# Patient Record
Sex: Female | Born: 1955
Health system: Southern US, Community
[De-identification: ages and names within clinical notes are randomized; demographics above are authoritative.]

## PROBLEM LIST (undated history)

## (undated) DIAGNOSIS — R519 Headache, unspecified: Secondary | ICD-10-CM

## (undated) DIAGNOSIS — K219 Gastro-esophageal reflux disease without esophagitis: Secondary | ICD-10-CM

## (undated) DIAGNOSIS — G4730 Sleep apnea, unspecified: Secondary | ICD-10-CM

## (undated) DIAGNOSIS — M1990 Unspecified osteoarthritis, unspecified site: Secondary | ICD-10-CM

## (undated) DIAGNOSIS — F419 Anxiety disorder, unspecified: Secondary | ICD-10-CM

## (undated) HISTORY — PX: OTHER SURGICAL HISTORY: SHX169

## (undated) HISTORY — PX: SHOULDER ARTHROSCOPY: SHX128

## (undated) HISTORY — PX: EYE SURGERY: SHX253

---

## 1999-05-31 ENCOUNTER — Encounter: Payer: Self-pay | Admitting: Obstetrics and Gynecology

## 1999-05-31 ENCOUNTER — Encounter: Admission: RE | Admit: 1999-05-31 | Discharge: 1999-05-31 | Payer: Self-pay | Admitting: Obstetrics and Gynecology

## 1999-06-05 ENCOUNTER — Encounter: Payer: Self-pay | Admitting: Obstetrics and Gynecology

## 1999-06-05 ENCOUNTER — Encounter: Admission: RE | Admit: 1999-06-05 | Discharge: 1999-06-05 | Payer: Self-pay | Admitting: Obstetrics and Gynecology

## 1999-12-11 ENCOUNTER — Encounter: Admission: RE | Admit: 1999-12-11 | Discharge: 1999-12-11 | Payer: Self-pay | Admitting: Diagnostic Radiology

## 1999-12-11 ENCOUNTER — Encounter: Payer: Self-pay | Admitting: Diagnostic Radiology

## 2000-06-20 ENCOUNTER — Encounter: Admission: RE | Admit: 2000-06-20 | Discharge: 2000-06-20 | Payer: Self-pay | Admitting: Obstetrics and Gynecology

## 2000-06-20 ENCOUNTER — Encounter: Payer: Self-pay | Admitting: Obstetrics and Gynecology

## 2001-08-12 ENCOUNTER — Encounter: Admission: RE | Admit: 2001-08-12 | Discharge: 2001-08-12 | Payer: Self-pay | Admitting: Obstetrics and Gynecology

## 2001-08-12 ENCOUNTER — Encounter: Payer: Self-pay | Admitting: Obstetrics and Gynecology

## 2002-09-22 ENCOUNTER — Encounter: Admission: RE | Admit: 2002-09-22 | Discharge: 2002-09-22 | Payer: Self-pay | Admitting: Obstetrics and Gynecology

## 2002-09-22 ENCOUNTER — Encounter: Payer: Self-pay | Admitting: Obstetrics and Gynecology

## 2004-02-14 ENCOUNTER — Encounter: Admission: RE | Admit: 2004-02-14 | Discharge: 2004-02-14 | Payer: Self-pay | Admitting: Family Medicine

## 2005-02-28 ENCOUNTER — Encounter: Admission: RE | Admit: 2005-02-28 | Discharge: 2005-02-28 | Payer: Self-pay | Admitting: Family Medicine

## 2006-10-28 ENCOUNTER — Encounter: Admission: RE | Admit: 2006-10-28 | Discharge: 2006-10-28 | Payer: Self-pay | Admitting: Family Medicine

## 2007-11-18 ENCOUNTER — Encounter: Admission: RE | Admit: 2007-11-18 | Discharge: 2007-11-18 | Payer: Self-pay | Admitting: Family Medicine

## 2007-11-25 ENCOUNTER — Encounter: Admission: RE | Admit: 2007-11-25 | Discharge: 2007-11-25 | Payer: Self-pay | Admitting: Family Medicine

## 2007-11-30 ENCOUNTER — Encounter: Admission: RE | Admit: 2007-11-30 | Discharge: 2007-11-30 | Payer: Self-pay | Admitting: Cardiology

## 2007-12-04 ENCOUNTER — Inpatient Hospital Stay (HOSPITAL_BASED_OUTPATIENT_CLINIC_OR_DEPARTMENT_OTHER): Admission: RE | Admit: 2007-12-04 | Discharge: 2007-12-04 | Payer: Self-pay | Admitting: Cardiology

## 2008-11-23 ENCOUNTER — Encounter: Admission: RE | Admit: 2008-11-23 | Discharge: 2008-11-23 | Payer: Self-pay | Admitting: Family Medicine

## 2009-12-04 ENCOUNTER — Encounter: Admission: RE | Admit: 2009-12-04 | Discharge: 2009-12-04 | Payer: Self-pay | Admitting: Family Medicine

## 2010-01-31 ENCOUNTER — Encounter: Admission: RE | Admit: 2010-01-31 | Discharge: 2010-01-31 | Payer: Self-pay | Admitting: Obstetrics and Gynecology

## 2010-08-26 ENCOUNTER — Encounter: Payer: Self-pay | Admitting: Family Medicine

## 2010-12-18 NOTE — Cardiovascular Report (Signed)
Cheyenne Miller, Cheyenne Miller NO.:  [id number]   MEDICAL RECORD NO.:  [id number]          PATIENT TYPE:  OIB   LOCATION:  1962                         FACILITY:  [facility]   PHYSICIAN:  Armanda Magic, M.D.     DATE OF BIRTH:  10/16/1955   DATE OF PROCEDURE:  12/04/2007  DATE OF DISCHARGE:                            CARDIAC CATHETERIZATION   REFERRING PHYSICIAN:  Gretta Arab. Valentina Lucks, MD   PROCEDURE:  Left heart catheterization, coronary angiography, and left  ventriculography.   OPERATOR:  Armanda Magic, MD   INDICATIONS:  Chest pain and abnormal stress Cardiolite study.   COMPLICATIONS:  None.   IV ACCESS:  Via right femoral artery 4-French sheath.   IV MEDICATIONS:  1. Versed 1 mg.  2. Fentanyl 25 mcg IV.   This is a 55 year old female with a history of recent episodes of  substernal chest pain.  She also has a history of dyslipidemia and  family history of heart disease.  She underwent a stress Cardiolite  study, which showed a small reversible defect in the apical inferior  region.  She now presents for cardiac catheterization.   The patient was brought to the cardiac catheterization laboratory in a  fasting nonsedated state.  Informed consent was obtained.  The patient  was connected to continuous heart rate and pulse oximetry monitoring and  intermittent blood pressure monitoring.  The right groin was prepped and  draped in a sterile fashion.  A 1% Xylocaine was used for local  anesthesia.  Using a modified Seldinger technique, a 4-French sheath was  placed in right femoral artery.  Under fluoroscopic guidance, a 4-French  JL-4 catheter was placed in left coronary artery.  Multiple cine films  were taken at 30-degree RAO and 40-degree LAO views.  This catheter was  then exchanged out over a guidewire for a 4-French JR-4 catheter, which  was placed under fluoroscopic guidance in the right coronary artery.  Multiple cine films were taken at 30-degree RAO and  40-degree LAO views.  This catheter was then exchanged out over a guidewire for 4-French  angled pigtail catheter, which was placed under fluoroscopic guidance in  the left ventricular cavity.  Left ventriculography was performed in a  30-degree RAO view using a total of 25 mL of contrast at 12 mL per  second.  The catheter was then pulled back across the aortic valve with  no significant gradient noted.  At the end procedure, all catheters and  sheaths were removed.  Manual compression was performed to adequate  hemostasis was obtained.  The patient was later discharged home after  her groin was stable, and she was ambulating.   RESULTS:  The left main coronary artery is widely patent and bifurcates  in the left anterior descending artery and left circumflex artery.   Left anterior descending artery is widely patent throughout its course.  The apex giving rise to a first diagonal, which is widely patent and  bifurcates in  two-daughter branch, both of which were widely patent.  The ongoing LAD is widely patent throughout its course.  The apex gives  rise to a second  large diagonal branch which trifurcates in three-  daughter branch, all of which were widely patent.   Left circumflex is widely patent throughout its course in the AV groove.  It gives rise to a high obtuse marginal one branch, which is small-to-  moderate in size and is widely patent and it gives rise to a second  larger obtuse marginal two branch, which is widely patent.  The ongoing  left circumflex is widely patent.   The right coronary artery is widely patent throughout its course  inferiorly and bifurcates into the posterior descending and posterior  lateral artery, both of which are widely patent.   Left ventriculography shows normal LV systolic function.  No regional  wall motion abnormalities.  Aortic pressure was 97/63 mmHg.  LV pressure  96/7 mmHg.  No significant aortic valve gradient.   IMPRESSION:  1.  Noncardiac chest pain.  2. Normal coronary arteries.  3. Normal LV function.  4. Dyslipidemia.   PLAN:  Discharge to home after IV fluids and bedrest had been completed,  and she will follow up with my nurse practitioner, Memory Dance, in 2  weeks.      Armanda Magic, M.D.  Electronically Signed     TT/MEDQ  D:  12/04/2007  T:  12/05/2007  Job:  [id number]   cc:   Gretta Arab. Valentina Lucks, M.D.

## 2010-12-25 ENCOUNTER — Other Ambulatory Visit: Payer: Self-pay | Admitting: Family Medicine

## 2010-12-25 DIAGNOSIS — Z1231 Encounter for screening mammogram for malignant neoplasm of breast: Secondary | ICD-10-CM

## 2010-12-28 ENCOUNTER — Ambulatory Visit
Admission: RE | Admit: 2010-12-28 | Discharge: 2010-12-28 | Disposition: A | Discharge status: Home / Self Care | Payer: 59 | Source: Ambulatory Visit | Attending: Family Medicine | Admitting: Family Medicine

## 2010-12-28 DIAGNOSIS — Z1231 Encounter for screening mammogram for malignant neoplasm of breast: Secondary | ICD-10-CM

## 2012-01-02 ENCOUNTER — Other Ambulatory Visit: Payer: Self-pay | Admitting: Family Medicine

## 2012-01-02 DIAGNOSIS — Z1231 Encounter for screening mammogram for malignant neoplasm of breast: Secondary | ICD-10-CM

## 2012-01-15 ENCOUNTER — Ambulatory Visit
Admission: RE | Admit: 2012-01-15 | Discharge: 2012-01-15 | Disposition: A | Discharge status: Home / Self Care | Payer: 59 | Source: Ambulatory Visit | Attending: Family Medicine | Admitting: Family Medicine

## 2012-01-15 DIAGNOSIS — Z1231 Encounter for screening mammogram for malignant neoplasm of breast: Secondary | ICD-10-CM

## 2013-01-13 ENCOUNTER — Other Ambulatory Visit: Payer: Self-pay

## 2013-01-13 DIAGNOSIS — Z1231 Encounter for screening mammogram for malignant neoplasm of breast: Secondary | ICD-10-CM

## 2013-02-17 ENCOUNTER — Ambulatory Visit
Admission: RE | Admit: 2013-02-17 | Discharge: 2013-02-17 | Disposition: A | Discharge status: Home / Self Care | Payer: Commercial Managed Care - PPO | Source: Ambulatory Visit

## 2013-02-17 DIAGNOSIS — Z1231 Encounter for screening mammogram for malignant neoplasm of breast: Secondary | ICD-10-CM

## 2014-01-24 ENCOUNTER — Other Ambulatory Visit: Payer: Self-pay

## 2014-01-24 DIAGNOSIS — Z1231 Encounter for screening mammogram for malignant neoplasm of breast: Secondary | ICD-10-CM

## 2014-02-18 ENCOUNTER — Encounter (INDEPENDENT_AMBULATORY_CARE_PROVIDER_SITE_OTHER): Payer: Self-pay

## 2014-02-18 ENCOUNTER — Ambulatory Visit
Admission: RE | Admit: 2014-02-18 | Discharge: 2014-02-18 | Disposition: A | Discharge status: Home / Self Care | Payer: Commercial Managed Care - PPO | Source: Ambulatory Visit

## 2014-02-18 DIAGNOSIS — Z1231 Encounter for screening mammogram for malignant neoplasm of breast: Secondary | ICD-10-CM

## 2015-01-26 ENCOUNTER — Other Ambulatory Visit: Payer: Self-pay

## 2015-01-26 DIAGNOSIS — Z1231 Encounter for screening mammogram for malignant neoplasm of breast: Secondary | ICD-10-CM

## 2015-03-02 ENCOUNTER — Ambulatory Visit
Admission: RE | Admit: 2015-03-02 | Discharge: 2015-03-02 | Disposition: A | Discharge status: Home / Self Care | Payer: Commercial Managed Care - PPO | Source: Ambulatory Visit

## 2015-03-02 DIAGNOSIS — Z1231 Encounter for screening mammogram for malignant neoplasm of breast: Secondary | ICD-10-CM

## 2016-01-22 ENCOUNTER — Encounter (INDEPENDENT_AMBULATORY_CARE_PROVIDER_SITE_OTHER): Payer: Commercial Managed Care - PPO | Admitting: Ophthalmology

## 2016-01-22 DIAGNOSIS — H353132 Nonexudative age-related macular degeneration, bilateral, intermediate dry stage: Secondary | ICD-10-CM

## 2016-01-22 DIAGNOSIS — H2513 Age-related nuclear cataract, bilateral: Secondary | ICD-10-CM | POA: Diagnosis not present

## 2016-01-22 DIAGNOSIS — H43813 Vitreous degeneration, bilateral: Secondary | ICD-10-CM | POA: Diagnosis not present

## 2016-01-22 DIAGNOSIS — H33302 Unspecified retinal break, left eye: Secondary | ICD-10-CM

## 2016-02-08 ENCOUNTER — Ambulatory Visit (INDEPENDENT_AMBULATORY_CARE_PROVIDER_SITE_OTHER): Payer: Commercial Managed Care - PPO | Admitting: Ophthalmology

## 2016-02-12 ENCOUNTER — Ambulatory Visit (INDEPENDENT_AMBULATORY_CARE_PROVIDER_SITE_OTHER): Payer: Commercial Managed Care - PPO | Admitting: Ophthalmology

## 2016-02-12 DIAGNOSIS — H33302 Unspecified retinal break, left eye: Secondary | ICD-10-CM

## 2016-02-13 ENCOUNTER — Other Ambulatory Visit: Payer: Self-pay | Admitting: Family Medicine

## 2016-02-13 DIAGNOSIS — Z1231 Encounter for screening mammogram for malignant neoplasm of breast: Secondary | ICD-10-CM

## 2016-03-04 ENCOUNTER — Ambulatory Visit: Payer: Commercial Managed Care - PPO

## 2016-03-14 ENCOUNTER — Ambulatory Visit
Admission: RE | Admit: 2016-03-14 | Discharge: 2016-03-14 | Disposition: A | Discharge status: Home / Self Care | Payer: Commercial Managed Care - PPO | Source: Ambulatory Visit | Attending: Family Medicine | Admitting: Family Medicine

## 2016-03-14 DIAGNOSIS — Z1231 Encounter for screening mammogram for malignant neoplasm of breast: Secondary | ICD-10-CM

## 2016-03-25 ENCOUNTER — Ambulatory Visit
Admission: RE | Admit: 2016-03-25 | Discharge: 2016-03-25 | Disposition: A | Discharge status: Home / Self Care | Payer: Commercial Managed Care - PPO | Source: Ambulatory Visit | Attending: Family Medicine | Admitting: Family Medicine

## 2016-03-25 ENCOUNTER — Other Ambulatory Visit: Payer: Self-pay | Admitting: Family Medicine

## 2016-03-25 DIAGNOSIS — R05 Cough: Secondary | ICD-10-CM

## 2016-03-25 DIAGNOSIS — R059 Cough, unspecified: Secondary | ICD-10-CM

## 2016-03-25 DIAGNOSIS — J40 Bronchitis, not specified as acute or chronic: Secondary | ICD-10-CM

## 2016-05-08 ENCOUNTER — Encounter: Payer: Self-pay | Admitting: Pulmonary Disease

## 2016-05-08 ENCOUNTER — Ambulatory Visit (INDEPENDENT_AMBULATORY_CARE_PROVIDER_SITE_OTHER): Payer: Commercial Managed Care - PPO | Admitting: Pulmonary Disease

## 2016-05-08 DIAGNOSIS — R05 Cough: Secondary | ICD-10-CM

## 2016-05-08 DIAGNOSIS — R053 Chronic cough: Secondary | ICD-10-CM | POA: Insufficient documentation

## 2016-05-08 MED ORDER — BENZONATATE 200 MG PO CAPS
200.0000 mg | ORAL_CAPSULE | Freq: Three times a day (TID) | ORAL | 0 refills | Status: AC | PRN
Start: 2016-05-08 — End: ?

## 2016-05-08 NOTE — Progress Notes (Signed)
Subjective:    Patient ID: Cheyenne Miller, female    DOB: August 28, 1955, 60 y.o.   MRN: [id number]  HPI  Chief Complaint  Patient presents with  . Pulmonary Consult    Referred by Dr. Laurann Montana; persistant cough.      60 year old never smoker presents for evaluation of chronic cough. She reports insidious onset of cough about 11 weeks ago productive of small pulse of yellow green sputum. She received 2 rounds of antibiotics and 2 rounds of prednisone but is still coughing. She was given Qvar about 3 weeks ago and this did not improve her symptoms but made her mouth sore, hence she stopped taking this a few days ago. Chest x-ray 03/16/16 showed no infiltrates and mild left basal scarring. She reports nocturnal cough that wakes her up about 2-3 times a night. She was given codeine cough syrup but this caused her to be unsteady and she lives by herself and she did not want to take this.  She denies postnasal drip or seasonal allergies. There is no childhood history of asthma. She was treated for reflux symptoms a few months ago with omeprazole and these have not recurred since then.  She has several grandkids and her youngest was diagnosed with RSV a few weeks after onset of her cough.  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   No past surgical history on file.  Allergies  Allergen Reactions  . Meloxicam     Caused Stomach Ulcers  . Latex Rash  . Sulfa Antibiotics Rash     Social History   Social History  . Marital status: Divorced    Spouse name: N/A  . Number of children: N/A  . Years of education: N/A   Occupational History  . Not on file.   Social History Main Topics  . Smoking status: Never Smoker  . Smokeless tobacco: Never Used  . Alcohol use 1.2 oz/week    2 Glasses of wine per week  . Drug use: No  . Sexual activity: Not  on file   Other Topics Concern  . Not on file   Social History Narrative  . No narrative on file     No family history on file.   Review of Systems  Constitutional: Negative for chills, fever and unexpected weight change.  HENT: Negative for congestion, dental problem, ear pain, nosebleeds, postnasal drip, rhinorrhea, sinus pressure, sneezing, sore throat, trouble swallowing and voice change.   Eyes: Negative for visual disturbance.  Respiratory: Positive for cough. Negative for choking and shortness of breath.   Cardiovascular: Negative for chest pain and leg swelling.  Gastrointestinal: Negative for abdominal pain, diarrhea and vomiting.  Genitourinary: Negative for difficulty urinating.  Musculoskeletal: Negative for arthralgias.  Skin: Negative for rash.  Neurological: Negative for tremors, syncope and headaches.  Hematological: Does not bruise/bleed easily.       Objective:   Physical Exam  Gen. Pleasant, obese, in no distress, normal affect ENT - no lesions, no post nasal drip, class 2-3 airway Neck: No JVD, no thyromegaly, no carotid bruits Lungs: no use of accessory muscles, no dullness to percussion, decreased without rales or rhonchi  Cardiovascular: Rhythm regular, heart sounds  normal, no murmurs or gallops, no peripheral edema Abdomen: soft and non-tender, no hepatosplenomegaly, BS normal. Musculoskeletal: No deformities, no cyanosis or clubbing Neuro:  alert, non focal, no tremors        Assessment & Plan:

## 2016-05-08 NOTE — Patient Instructions (Signed)
You have a post bronchitic cough  this may be perpetuated by GERD  Stop QVAR. Take store brand Sudafed once daily for 2 weeks Take Delsym cough syrup 5 ML at bedtime OK to take Mucinex in the daytime We'll start benzonatate 200 mg thrice daily as needed for cough  Take omeprazole at bedtime for reflux for 4 weeks  Contact us in 4 weeks if cough persist or no better

## 2016-05-08 NOTE — Assessment & Plan Note (Signed)
You have a post bronchitic cough  this may be perpetuated by GERD  Stop QVAR. Take store brand Sudafed once daily for 2 weeks Take Delsym cough syrup 5 ML at bedtime OK to take Mucinex in the daytime We'll start benzonatate 200 mg thrice daily as needed for cough  Take omeprazole at bedtime for reflux for 4 weeks  Contact us in 4 weeks if cough persist or no better

## 2016-06-17 ENCOUNTER — Ambulatory Visit (INDEPENDENT_AMBULATORY_CARE_PROVIDER_SITE_OTHER): Payer: Commercial Managed Care - PPO | Admitting: Ophthalmology

## 2016-06-17 DIAGNOSIS — H2513 Age-related nuclear cataract, bilateral: Secondary | ICD-10-CM | POA: Diagnosis not present

## 2016-06-17 DIAGNOSIS — H43813 Vitreous degeneration, bilateral: Secondary | ICD-10-CM | POA: Diagnosis not present

## 2016-06-17 DIAGNOSIS — H353132 Nonexudative age-related macular degeneration, bilateral, intermediate dry stage: Secondary | ICD-10-CM | POA: Diagnosis not present

## 2016-06-17 DIAGNOSIS — H33302 Unspecified retinal break, left eye: Secondary | ICD-10-CM | POA: Diagnosis not present

## 2016-07-03 ENCOUNTER — Other Ambulatory Visit: Payer: Commercial Managed Care - PPO

## 2016-07-03 ENCOUNTER — Encounter: Payer: Self-pay | Admitting: Acute Care

## 2016-07-03 ENCOUNTER — Ambulatory Visit (INDEPENDENT_AMBULATORY_CARE_PROVIDER_SITE_OTHER)
Admission: RE | Admit: 2016-07-03 | Discharge: 2016-07-03 | Disposition: A | Discharge status: Home / Self Care | Payer: Commercial Managed Care - PPO | Source: Ambulatory Visit | Attending: Acute Care | Admitting: Acute Care

## 2016-07-03 ENCOUNTER — Ambulatory Visit (INDEPENDENT_AMBULATORY_CARE_PROVIDER_SITE_OTHER): Payer: Commercial Managed Care - PPO | Admitting: Acute Care

## 2016-07-03 VITALS — BP 128/64 | HR 90 | Ht 64.0 in | Wt 195.0 lb

## 2016-07-03 DIAGNOSIS — R05 Cough: Secondary | ICD-10-CM

## 2016-07-03 DIAGNOSIS — J329 Chronic sinusitis, unspecified: Secondary | ICD-10-CM | POA: Diagnosis not present

## 2016-07-03 DIAGNOSIS — R053 Chronic cough: Secondary | ICD-10-CM

## 2016-07-03 LAB — NITRIC OXIDE: Nitric Oxide: 17

## 2016-07-03 MED ORDER — AMOXICILLIN-POT CLAVULANATE 500-125 MG PO TABS: 1.0000 | ORAL_TABLET | Freq: Two times a day (BID) | ORAL | 0 refills | Status: DC

## 2016-07-03 NOTE — Progress Notes (Signed)
History of Present Illness Cheyenne Miller is a 60 y.o. female never smoker  with chronic cough since July 2017. She is followed by Dr. Elsworth Soho.  Synopsis: 60 year old never smoker presents for evaluation of chronic cough. She reports insidious onset of cough about 11 weeks ago productive of small pulse of yellow green sputum. She received 2 rounds of antibiotics and 2 rounds of prednisone but is still coughing. She was given Qvar about 3 weeks ago and this did not improve her symptoms but made her mouth sore, hence she stopped taking this a few days ago. She denies postnasal drip or seasonal allergies. There is no childhood history of asthma. She has been treated with omeprazole for reflux. Chest x-ray 03/16/16 showed no infiltrates and mild left basal scarring. She reports nocturnal cough that wakes her up about 2-3 times a night. She was given codeine cough syrup but this caused her to be unsteady and she lives by herself and she did not want to take this.    07/03/2016 Acute OV for Chronic Cough Pt. Presents to the office today for continued chronic cough. She was last seen in the office 05/08/2016. At that time Dr. Elsworth Soho prescribed the following:  Stop QVAR. Take store brand Sudafed once daily for 2 weeks Take Delsym cough syrup 5 ML at bedtime OK to take Mucinex in the daytime We'll start benzonatate 200 mg thrice daily as needed for cough Take omeprazole at bedtime for reflux for 4 weeks.  She returns today with continued cough. She states it is better but it is not gone. She has also now developed a pounding headache with ringing ears and a dark green nasal discharge. She states she has post nasal drip. She has throat clearing today.She has started taking a medication for cold and sinus pressure, which has helped a bit..She denies fever, chest pain, orthopnea or hemoptysis.She is most worried about a possible sinus infection.  Tests FENO= 17  Past medical hx No past medical  history on file.   Past surgical hx, Family hx, Social hx all reviewed.  Current Outpatient Prescriptions on File Prior to Visit  Medication Sig  . aspirin 81 MG chewable tablet Chew 81 mg by mouth daily.  . benzonatate (TESSALON) 200 MG capsule Take 1 capsule (200 mg total) by mouth 3 (three) times daily as needed for cough.  . Calcium Carbonate-Vitamin D (CALTRATE 600+D PO) Take by mouth.  . Multiple Vitamins-Minerals (ICAPS AREDS 2 PO) Take 2 capsules by mouth daily.  Marland Kitchen OVER THE COUNTER MEDICATION IB guard (take 20 min before or after eating)  . rosuvastatin (CRESTOR) 5 MG tablet Take 5 mg by mouth daily.  . sertraline (ZOLOFT) 100 MG tablet Take 100 mg by mouth daily.  . SUMAtriptan (IMITREX) 100 MG tablet Take 100 mg by mouth every 2 (two) hours as needed for migraine. May repeat in 2 hours if headache persists or recurs.   No current facility-administered medications on file prior to visit.      Allergies  Allergen Reactions  . Meloxicam     Caused Stomach Ulcers  . Latex Rash  . Sulfa Antibiotics Rash    Review Of Systems:  Constitutional:   No  weight loss, night sweats,  Fevers, chills, fatigue, or  lassitude.  HEENT:   + headaches,  Difficulty swallowing,  Tooth/dental problems, or  Sore throat,                No sneezing, itching, ear ache, nasal  congestion, +post nasal drip,   CV:  No chest pain,  Orthopnea, PND, swelling in lower extremities, anasarca, dizziness, palpitations, syncope.   GI  No heartburn, indigestion, abdominal pain, nausea, vomiting, diarrhea, change in bowel habits, loss of appetite, bloody stools.   Resp: No shortness of breath with exertion or at rest.  + excess mucus, + productive cough,  No non-productive cough,  No coughing up of blood.  + change in color of mucus.  No wheezing.  No chest wall deformity  Skin: no rash or lesions.  GU: no dysuria, change in color of urine, no urgency or frequency.  No flank pain, no hematuria   MS:  No  joint pain or swelling.  No decreased range of motion.  No back pain.  Psych:  No change in mood or affect. No depression or anxiety.  No memory loss.   Vital Signs BP 128/64 (BP Location: Left Arm, Cuff Size: Normal)   Pulse 90   Ht 5\' 4"  (1.626 m)   Wt 195 lb (88.5 kg)   SpO2 98%   BMI 33.47 kg/m    Physical Exam:  General- No distress,  A&Ox3, pleasant, coughing ENT: No sinus tenderness, TM clear, pale nasal mucosa, no oral exudate,no post nasal drip, no LAN Cardiac: S1, S2, regular rate and rhythm, no murmur Chest: No wheeze/ crackles left base,/ no dullness; no accessory muscle use, no nasal flaring, no sternal retractions Abd.: Soft Non-tender Ext: No clubbing cyanosis, edema Neuro:  normal strength Skin: No rashes, warm and dry Psych: normal mood and behavior   Assessment/Plan  Sinusitis, chronic We will check a FENO today We will check allergy blood work. CT Sinus. CXR today. We will call you with results. Copy of GERD Diet No Mint, menthol, no chocolate Sips of water instead of throat clearing. Sugar Free Jolly Ranchers Try Flonase 2 squirts  Up each nare each morning Zantac 150 mg at bedtime. Augmentin 500 mg every 12 hours for your sinus infection Follow up with Dr. Elsworth Soho or NP in 3-4 weeks Please contact office for sooner follow up if symptoms do not improve or worsen or seek emergency care   Crackles noted per left  base , may need HRCT if cough does not clear after sinusitis resolved.    Magdalen Spatz, NP 07/03/2016  2:41 PM

## 2016-07-03 NOTE — Patient Instructions (Addendum)
It is nice to meet you today. We will check a FENO today We will check allergy blood work. CT Sinus. CXR today. We will call you with results. Copy of GERD Diet No Mint, menthol, no chocolate Sips of water instead of throat clearing. Sugar Free Jolly Ranchers Try Flonase 2 squirts  Up each nare each morning Zantac 150 mg at bedtime. Augmentin 500 mg every 12 hours for your sinus infection Follow up with Dr. Elsworth Soho or NP in 3-4 weeks Please contact office for sooner follow up if symptoms do not improve or worsen or seek emergency care

## 2016-07-03 NOTE — Assessment & Plan Note (Signed)
We will check a FENO today We will check allergy blood work. CT Sinus. CXR today. We will call you with results. Copy of GERD Diet No Mint, menthol, no chocolate Sips of water instead of throat clearing. Sugar Free Jolly Ranchers Try Flonase 2 squirts  Up each nare each morning Zantac 150 mg at bedtime. Augmentin 500 mg every 12 hours for your sinus infection Follow up with Dr. Elsworth Soho or NP in 3-4 weeks Please contact office for sooner follow up if symptoms do not improve or worsen or seek emergency care   Crackles noted per left  base , may need HRCT if cough does not clear after sinusitis resolved.

## 2016-07-04 LAB — RESPIRATORY ALLERGY PROFILE REGION II ~~LOC~~
Allergen, A. alternata, m6: <0.1 kU/L
Allergen, Comm Silver Birch, t9: <0.1 kU/L
Allergen, D pternoyssinus,d7: <0.1 kU/L
Allergen, Mulberry, t76: <0.1 kU/L
Allergen, Oak,t7: <0.1 kU/L
Box Elder IgE: <0.1 kU/L
Cat Dander: <0.1 kU/L
Cockroach: <0.1 kU/L
D. farinae: <0.1 kU/L
IGE (IMMUNOGLOBULIN E), SERUM: 3 kU/L (ref <115)
Johnson Grass: <0.1 kU/L
Rough Pigweed  IgE: <0.1 kU/L
Sheep Sorrel IgE: <0.1 kU/L

## 2016-07-04 NOTE — Progress Notes (Signed)
Reviewed & agree with plan  

## 2016-07-10 ENCOUNTER — Ambulatory Visit (INDEPENDENT_AMBULATORY_CARE_PROVIDER_SITE_OTHER)
Admission: RE | Admit: 2016-07-10 | Discharge: 2016-07-10 | Disposition: A | Discharge status: Home / Self Care | Payer: Commercial Managed Care - PPO | Source: Ambulatory Visit | Attending: Acute Care | Admitting: Acute Care

## 2016-07-10 DIAGNOSIS — R05 Cough: Secondary | ICD-10-CM | POA: Diagnosis not present

## 2016-07-10 DIAGNOSIS — R053 Chronic cough: Secondary | ICD-10-CM

## 2016-08-01 ENCOUNTER — Encounter: Payer: Self-pay | Admitting: Acute Care

## 2016-08-01 ENCOUNTER — Ambulatory Visit (INDEPENDENT_AMBULATORY_CARE_PROVIDER_SITE_OTHER): Payer: Commercial Managed Care - PPO | Admitting: Acute Care

## 2016-08-01 VITALS — BP 118/72 | HR 85 | Ht 64.0 in | Wt 190.0 lb

## 2016-08-01 DIAGNOSIS — H9319 Tinnitus, unspecified ear: Secondary | ICD-10-CM | POA: Diagnosis not present

## 2016-08-01 DIAGNOSIS — R05 Cough: Secondary | ICD-10-CM

## 2016-08-01 DIAGNOSIS — J329 Chronic sinusitis, unspecified: Secondary | ICD-10-CM

## 2016-08-01 DIAGNOSIS — R0981 Nasal congestion: Secondary | ICD-10-CM

## 2016-08-01 DIAGNOSIS — R053 Chronic cough: Secondary | ICD-10-CM

## 2016-08-01 NOTE — Progress Notes (Signed)
History of Present Illness Cheyenne Miller is a 60 y.o. female never smoker with chronic cough since July 2017. She is followed by Dr. Elsworth Soho.  Synopsis: 60 year old never smoker presents for evaluation of chronic cough. She reports insidious onset of cough about 11 weeks ago productive of small pulse of yellow green sputum.She received 2 rounds of antibiotics and 2 rounds of prednisone but is still coughing.She was given Qvar about 3 weeks ago and this did not improve her symptoms but made her mouth sore, hence she stopped taking this a few days ago. She denies postnasal drip or seasonal allergies. There is no childhood history of asthma. She has been treated with omeprazole for reflux. Chest x-ray 03/16/16 showed no infiltrates and mild left basal scarring. She reports nocturnal cough that wakes her up about 2-3 times a night. She was given codeine cough syrup but this caused her to be unsteady and she lives by herself and she did not want to take this.   Dr. Bari Mantis recommendations 05/08/2016:  Stop QVAR. Take store brand Sudafed once daily for 2 weeks Take Delsym cough syrup 5 ML at bedtime OK to take Mucinex in the daytime We'll start benzonatate 200 mg thrice daily as needed for cough Take omeprazole at bedtime for reflux for 4 weeks.  Follow up 07/03/2016: Continued cough and concern for sinus infection Allergy labs were checked, CT Sinus ordered CXR clear Augmentin x 7 days  08/01/2016 Follow up OV:  Pt. Presents today for follow up of sinusitis. Pt. Presents to the office for follow up of sinus infection.She states she did get better after the Augmentin 07/03/16. No further signs or symptoms of sinus infection. She presents today for follow up. She states that while her sinus infection is better,  she has her cough again. It is worse in the morning after waking up, and does wake her at night.The cough is productive for light yellow to pale green secretions She denies any  fever. She is not using Claritin or Zyrtec, she is compliant with her zantac and Flonase. She has a lot of throat clearing and states she does have post nasal drip. She is using her Tessalon Pearles  As needed for cough. She denies fever , chest pain, orthopnea or hemoptysis. She has cough at beginning of inspiration. I did consult with Dr. Melvyn Novas and have added his suggestions to [name]'s plan of care. She will follow up with Dr. Elsworth Soho in 6 weeks to see if any changes in her paln of care have improved her cough. She denies fever, chest pain, orthopnea or hemoptysis. She is in no apparent distress today in the office. We did discuss the results of her sinus CT. She requested a referral to Dr. Thornell Mule, who has seen her in the past and has told her to return to him for continued sinus issues. I will write for the referral.  Tests 07/10/2016 CT Sinuses IMPRESSION: Isolated right maxillary sinus opacification compatible with acute sinusitis.  Symmetric appearing nasal cavity mucosal thickening raising the possibility of rhinitis.  Past medical hx No past medical history on file.   Past surgical hx, Family hx, Social hx all reviewed.  Current Outpatient Prescriptions on File Prior to Visit  Medication Sig  . aspirin 81 MG chewable tablet Chew 81 mg by mouth daily.  . benzonatate (TESSALON) 200 MG capsule Take 1 capsule (200 mg total) by mouth 3 (three) times daily as needed for cough.  . Calcium Carbonate-Vitamin D (CALTRATE 600+D  PO) Take by mouth.  . Multiple Vitamins-Minerals (ICAPS AREDS 2 PO) Take 2 capsules by mouth daily.  Marland Kitchen OVER THE COUNTER MEDICATION IB guard (take 20 min before or after eating)  . rosuvastatin (CRESTOR) 5 MG tablet Take 5 mg by mouth daily.  . sertraline (ZOLOFT) 100 MG tablet Take 100 mg by mouth daily.  . SUMAtriptan (IMITREX) 100 MG tablet Take 100 mg by mouth every 2 (two) hours as needed for migraine. May repeat in 2 hours if headache persists or recurs.   No  current facility-administered medications on file prior to visit.      Allergies  Allergen Reactions  . Meloxicam     Caused Stomach Ulcers  . Latex Rash  . Sulfa Antibiotics Rash    Review Of Systems:  Constitutional:   No  weight loss, night sweats,  Fevers, chills, fatigue, or  lassitude.  HEENT:   No headaches,  Difficulty swallowing,  Tooth/dental problems, or  Sore throat,                No sneezing, itching, ear ache, nasal congestion, post nasal drip,   CV:  No chest pain,  Orthopnea, PND, swelling in lower extremities, anasarca, dizziness, palpitations, syncope.   GI  No heartburn, indigestion, abdominal pain, nausea, vomiting, diarrhea, change in bowel habits, loss of appetite, bloody stools.   Resp: No shortness of breath with exertion or at rest.  No excess mucus, no productive cough,  No non-productive cough,  No coughing up of blood.  No change in color of mucus.  No wheezing.  No chest wall deformity  Skin: no rash or lesions.  GU: no dysuria, change in color of urine, no urgency or frequency.  No flank pain, no hematuria   MS:  No joint pain or swelling.  No decreased range of motion.  No back pain.  Psych:  No change in mood or affect. No depression or anxiety.  No memory loss.   Vital Signs BP 118/72 (BP Location: Right Arm, Patient Position: Sitting, Cuff Size: Normal)   Pulse 85   Ht 5\' 4"  (1.626 m)   Wt 190 lb (86.2 kg)   SpO2 97%   BMI 32.61 kg/m    Physical Exam:  General- No distress,  A&Ox3 ENT: No sinus tenderness, TM clear, pale nasal mucosa, no oral exudate,no post nasal drip, no LAN Cardiac: S1, S2, regular rate and rhythm, no murmur Chest: No wheeze/ rales/ dullness; no accessory muscle use, no nasal flaring, no sternal retractions Abd.: Soft Non-tender Ext: No clubbing cyanosis, edema Neuro:  normal strength Skin: No rashes, warm and dry Psych: normal mood and behavior   Assessment/Plan  Chronic cough Recurrent  cough Plan: Try adding Claritin or Zyrtec once daily for post nasal drip. Add Mucinex 1200 mg twice daily for chest congestion. Delsym cough syrup during the day for cough. Avoid mint and menthol Sips of water instead of throat clearing. Referral to Dr. Thornell Mule ENT for continued ringing in her ears and continued  nasal congestion. Protonix twice daily before breakfast and dinner. Stop Zantac at night and add pepcid before bed. Follow up in 6 weeks with Dr. Elsworth Soho or NP. Please contact office for sooner follow up if symptoms do not improve or worsen or seek emergency care   Sinusitis, chronic Resolved after treatment with Augmentin    Magdalen Spatz, NP 08/01/2016  2:27 PM

## 2016-08-01 NOTE — Patient Instructions (Addendum)
It is nice to see you again. Try adding Claritin or Zyrtec once daily for post nasal drip. Add Mucinex 1200 mg twice daily for chest congestion. Delsym cough syrup during the day for cough. Avoid mint and menthol Sips of water instead of throat clearing. Referral to Dr. Thornell Mule ENT for continued ringing in her ears and continued  nasal congestion. Protonix twice daily before breakfast and dinner. Stop Zantac at night and add pepcid before bed. Follow up in 6 weeks with Dr. Elsworth Soho or NP. Please contact office for sooner follow up if symptoms do not improve or worsen or seek emergency care

## 2016-08-01 NOTE — Assessment & Plan Note (Signed)
Resolved after treatment with Augmentin

## 2016-08-01 NOTE — Assessment & Plan Note (Signed)
Recurrent cough Plan: Try adding Claritin or Zyrtec once daily for post nasal drip. Add Mucinex 1200 mg twice daily for chest congestion. Delsym cough syrup during the day for cough. Avoid mint and menthol Sips of water instead of throat clearing. Referral to Dr. Thornell Mule ENT for continued ringing in her ears and continued  nasal congestion. Protonix twice daily before breakfast and dinner. Stop Zantac at night and add pepcid before bed. Follow up in 6 weeks with Dr. Elsworth Soho or NP. Please contact office for sooner follow up if symptoms do not improve or worsen or seek emergency care

## 2016-08-09 DIAGNOSIS — M7541 Impingement syndrome of right shoulder: Secondary | ICD-10-CM | POA: Diagnosis not present

## 2016-08-13 DIAGNOSIS — M7541 Impingement syndrome of right shoulder: Secondary | ICD-10-CM | POA: Diagnosis not present

## 2016-08-14 DIAGNOSIS — Z0000 Encounter for general adult medical examination without abnormal findings: Secondary | ICD-10-CM | POA: Diagnosis not present

## 2016-08-14 DIAGNOSIS — E7800 Pure hypercholesterolemia, unspecified: Secondary | ICD-10-CM | POA: Diagnosis not present

## 2016-08-20 DIAGNOSIS — M7541 Impingement syndrome of right shoulder: Secondary | ICD-10-CM | POA: Diagnosis not present

## 2016-09-03 DIAGNOSIS — M7541 Impingement syndrome of right shoulder: Secondary | ICD-10-CM | POA: Diagnosis not present

## 2016-09-09 DIAGNOSIS — M7541 Impingement syndrome of right shoulder: Secondary | ICD-10-CM | POA: Diagnosis not present

## 2016-09-12 DIAGNOSIS — H9313 Tinnitus, bilateral: Secondary | ICD-10-CM | POA: Diagnosis not present

## 2016-09-12 DIAGNOSIS — H6981 Other specified disorders of Eustachian tube, right ear: Secondary | ICD-10-CM | POA: Diagnosis not present

## 2016-09-12 DIAGNOSIS — J342 Deviated nasal septum: Secondary | ICD-10-CM | POA: Diagnosis not present

## 2016-09-16 DIAGNOSIS — M19011 Primary osteoarthritis, right shoulder: Secondary | ICD-10-CM | POA: Diagnosis not present

## 2016-09-16 DIAGNOSIS — M7541 Impingement syndrome of right shoulder: Secondary | ICD-10-CM | POA: Diagnosis not present

## 2016-09-17 ENCOUNTER — Ambulatory Visit: Payer: Commercial Managed Care - PPO | Admitting: Pulmonary Disease

## 2016-09-25 DIAGNOSIS — M8588 Other specified disorders of bone density and structure, other site: Secondary | ICD-10-CM | POA: Diagnosis not present

## 2016-09-25 DIAGNOSIS — Z13820 Encounter for screening for osteoporosis: Secondary | ICD-10-CM | POA: Diagnosis not present

## 2017-01-07 DIAGNOSIS — H2513 Age-related nuclear cataract, bilateral: Secondary | ICD-10-CM | POA: Diagnosis not present

## 2017-01-07 DIAGNOSIS — H43813 Vitreous degeneration, bilateral: Secondary | ICD-10-CM | POA: Diagnosis not present

## 2017-01-07 DIAGNOSIS — H353132 Nonexudative age-related macular degeneration, bilateral, intermediate dry stage: Secondary | ICD-10-CM | POA: Diagnosis not present

## 2017-02-03 DIAGNOSIS — M25562 Pain in left knee: Secondary | ICD-10-CM | POA: Diagnosis not present

## 2017-02-06 ENCOUNTER — Other Ambulatory Visit: Payer: Self-pay | Admitting: Family Medicine

## 2017-02-06 DIAGNOSIS — Z1231 Encounter for screening mammogram for malignant neoplasm of breast: Secondary | ICD-10-CM

## 2017-02-28 DIAGNOSIS — E7800 Pure hypercholesterolemia, unspecified: Secondary | ICD-10-CM | POA: Diagnosis not present

## 2017-04-08 ENCOUNTER — Ambulatory Visit: Payer: Commercial Managed Care - PPO

## 2017-04-16 ENCOUNTER — Ambulatory Visit
Admission: RE | Admit: 2017-04-16 | Discharge: 2017-04-16 | Disposition: A | Discharge status: Home / Self Care | Payer: Commercial Managed Care - PPO | Source: Ambulatory Visit | Attending: Family Medicine | Admitting: Family Medicine

## 2017-04-16 DIAGNOSIS — Z1231 Encounter for screening mammogram for malignant neoplasm of breast: Secondary | ICD-10-CM

## 2017-05-21 DIAGNOSIS — M19011 Primary osteoarthritis, right shoulder: Secondary | ICD-10-CM | POA: Diagnosis not present

## 2017-05-21 DIAGNOSIS — M25511 Pain in right shoulder: Secondary | ICD-10-CM | POA: Diagnosis not present

## 2017-05-21 DIAGNOSIS — M7541 Impingement syndrome of right shoulder: Secondary | ICD-10-CM | POA: Diagnosis not present

## 2017-05-30 DIAGNOSIS — K589 Irritable bowel syndrome without diarrhea: Secondary | ICD-10-CM | POA: Diagnosis not present

## 2017-05-30 DIAGNOSIS — M25519 Pain in unspecified shoulder: Secondary | ICD-10-CM | POA: Diagnosis not present

## 2017-06-04 DIAGNOSIS — M25562 Pain in left knee: Secondary | ICD-10-CM | POA: Diagnosis not present

## 2017-06-16 DIAGNOSIS — G8918 Other acute postprocedural pain: Secondary | ICD-10-CM | POA: Diagnosis not present

## 2017-06-16 DIAGNOSIS — M24111 Other articular cartilage disorders, right shoulder: Secondary | ICD-10-CM | POA: Diagnosis not present

## 2017-06-16 DIAGNOSIS — M19011 Primary osteoarthritis, right shoulder: Secondary | ICD-10-CM | POA: Diagnosis not present

## 2017-06-16 DIAGNOSIS — S43431A Superior glenoid labrum lesion of right shoulder, initial encounter: Secondary | ICD-10-CM | POA: Diagnosis not present

## 2017-06-16 DIAGNOSIS — M7541 Impingement syndrome of right shoulder: Secondary | ICD-10-CM | POA: Diagnosis not present

## 2017-06-20 DIAGNOSIS — M25611 Stiffness of right shoulder, not elsewhere classified: Secondary | ICD-10-CM | POA: Diagnosis not present

## 2017-06-30 DIAGNOSIS — M25611 Stiffness of right shoulder, not elsewhere classified: Secondary | ICD-10-CM | POA: Diagnosis not present

## 2017-06-30 DIAGNOSIS — M25562 Pain in left knee: Secondary | ICD-10-CM | POA: Diagnosis not present

## 2017-07-02 DIAGNOSIS — M25611 Stiffness of right shoulder, not elsewhere classified: Secondary | ICD-10-CM | POA: Diagnosis not present

## 2017-07-07 DIAGNOSIS — M25611 Stiffness of right shoulder, not elsewhere classified: Secondary | ICD-10-CM | POA: Diagnosis not present

## 2017-07-09 DIAGNOSIS — M25611 Stiffness of right shoulder, not elsewhere classified: Secondary | ICD-10-CM | POA: Diagnosis not present

## 2017-07-11 DIAGNOSIS — M25611 Stiffness of right shoulder, not elsewhere classified: Secondary | ICD-10-CM | POA: Diagnosis not present

## 2017-07-16 DIAGNOSIS — M25611 Stiffness of right shoulder, not elsewhere classified: Secondary | ICD-10-CM | POA: Diagnosis not present

## 2017-07-16 DIAGNOSIS — M1712 Unilateral primary osteoarthritis, left knee: Secondary | ICD-10-CM | POA: Diagnosis not present

## 2017-07-16 DIAGNOSIS — M25562 Pain in left knee: Secondary | ICD-10-CM | POA: Diagnosis not present

## 2017-07-18 DIAGNOSIS — H353132 Nonexudative age-related macular degeneration, bilateral, intermediate dry stage: Secondary | ICD-10-CM | POA: Diagnosis not present

## 2017-07-18 DIAGNOSIS — H43813 Vitreous degeneration, bilateral: Secondary | ICD-10-CM | POA: Diagnosis not present

## 2017-07-18 DIAGNOSIS — H2513 Age-related nuclear cataract, bilateral: Secondary | ICD-10-CM | POA: Diagnosis not present

## 2017-07-21 DIAGNOSIS — M25611 Stiffness of right shoulder, not elsewhere classified: Secondary | ICD-10-CM | POA: Diagnosis not present

## 2017-07-30 DIAGNOSIS — M25611 Stiffness of right shoulder, not elsewhere classified: Secondary | ICD-10-CM | POA: Diagnosis not present

## 2017-07-30 DIAGNOSIS — M25511 Pain in right shoulder: Secondary | ICD-10-CM | POA: Diagnosis not present

## 2017-08-01 DIAGNOSIS — M25611 Stiffness of right shoulder, not elsewhere classified: Secondary | ICD-10-CM | POA: Diagnosis not present

## 2017-08-04 DIAGNOSIS — M25611 Stiffness of right shoulder, not elsewhere classified: Secondary | ICD-10-CM | POA: Diagnosis not present

## 2017-08-12 DIAGNOSIS — M25611 Stiffness of right shoulder, not elsewhere classified: Secondary | ICD-10-CM | POA: Diagnosis not present

## 2017-08-18 DIAGNOSIS — M25611 Stiffness of right shoulder, not elsewhere classified: Secondary | ICD-10-CM | POA: Diagnosis not present

## 2017-08-22 DIAGNOSIS — Z0000 Encounter for general adult medical examination without abnormal findings: Secondary | ICD-10-CM | POA: Diagnosis not present

## 2017-08-22 DIAGNOSIS — E7800 Pure hypercholesterolemia, unspecified: Secondary | ICD-10-CM | POA: Diagnosis not present

## 2017-08-22 DIAGNOSIS — G43109 Migraine with aura, not intractable, without status migrainosus: Secondary | ICD-10-CM | POA: Diagnosis not present

## 2017-08-26 DIAGNOSIS — M25611 Stiffness of right shoulder, not elsewhere classified: Secondary | ICD-10-CM | POA: Diagnosis not present

## 2017-08-29 DIAGNOSIS — M25611 Stiffness of right shoulder, not elsewhere classified: Secondary | ICD-10-CM | POA: Diagnosis not present

## 2017-09-05 DIAGNOSIS — M25611 Stiffness of right shoulder, not elsewhere classified: Secondary | ICD-10-CM | POA: Diagnosis not present

## 2017-09-10 DIAGNOSIS — M25611 Stiffness of right shoulder, not elsewhere classified: Secondary | ICD-10-CM | POA: Diagnosis not present

## 2017-09-17 DIAGNOSIS — M25611 Stiffness of right shoulder, not elsewhere classified: Secondary | ICD-10-CM | POA: Diagnosis not present

## 2017-10-02 DIAGNOSIS — M19011 Primary osteoarthritis, right shoulder: Secondary | ICD-10-CM | POA: Diagnosis not present

## 2017-10-02 DIAGNOSIS — Z9889 Other specified postprocedural states: Secondary | ICD-10-CM | POA: Diagnosis not present

## 2017-10-16 DIAGNOSIS — M791 Myalgia, unspecified site: Secondary | ICD-10-CM | POA: Diagnosis not present

## 2017-10-16 DIAGNOSIS — J09X2 Influenza due to identified novel influenza A virus with other respiratory manifestations: Secondary | ICD-10-CM | POA: Diagnosis not present

## 2017-12-10 DIAGNOSIS — L820 Inflamed seborrheic keratosis: Secondary | ICD-10-CM | POA: Diagnosis not present

## 2017-12-10 DIAGNOSIS — L814 Other melanin hyperpigmentation: Secondary | ICD-10-CM | POA: Diagnosis not present

## 2017-12-10 DIAGNOSIS — L570 Actinic keratosis: Secondary | ICD-10-CM | POA: Diagnosis not present

## 2018-01-20 DIAGNOSIS — H43813 Vitreous degeneration, bilateral: Secondary | ICD-10-CM | POA: Diagnosis not present

## 2018-01-20 DIAGNOSIS — H353132 Nonexudative age-related macular degeneration, bilateral, intermediate dry stage: Secondary | ICD-10-CM | POA: Diagnosis not present

## 2018-01-20 DIAGNOSIS — H2513 Age-related nuclear cataract, bilateral: Secondary | ICD-10-CM | POA: Diagnosis not present

## 2018-01-28 DIAGNOSIS — L814 Other melanin hyperpigmentation: Secondary | ICD-10-CM | POA: Diagnosis not present

## 2018-01-28 DIAGNOSIS — L821 Other seborrheic keratosis: Secondary | ICD-10-CM | POA: Diagnosis not present

## 2018-01-28 DIAGNOSIS — L570 Actinic keratosis: Secondary | ICD-10-CM | POA: Diagnosis not present

## 2018-01-28 DIAGNOSIS — D1801 Hemangioma of skin and subcutaneous tissue: Secondary | ICD-10-CM | POA: Diagnosis not present

## 2018-02-09 DIAGNOSIS — Z0000 Encounter for general adult medical examination without abnormal findings: Secondary | ICD-10-CM | POA: Diagnosis not present

## 2018-02-09 DIAGNOSIS — Z136 Encounter for screening for cardiovascular disorders: Secondary | ICD-10-CM | POA: Diagnosis not present

## 2018-03-10 ENCOUNTER — Other Ambulatory Visit: Payer: Self-pay | Admitting: Family Medicine

## 2018-03-10 DIAGNOSIS — Z1231 Encounter for screening mammogram for malignant neoplasm of breast: Secondary | ICD-10-CM

## 2018-04-27 ENCOUNTER — Ambulatory Visit
Admission: RE | Admit: 2018-04-27 | Discharge: 2018-04-27 | Disposition: A | Discharge status: Home / Self Care | Payer: Commercial Managed Care - PPO | Source: Ambulatory Visit | Attending: Family Medicine | Admitting: Family Medicine

## 2018-04-27 DIAGNOSIS — Z1231 Encounter for screening mammogram for malignant neoplasm of breast: Secondary | ICD-10-CM | POA: Diagnosis not present

## 2018-07-07 DIAGNOSIS — H18822 Corneal disorder due to contact lens, left eye: Secondary | ICD-10-CM | POA: Diagnosis not present

## 2018-07-10 DIAGNOSIS — H18822 Corneal disorder due to contact lens, left eye: Secondary | ICD-10-CM | POA: Diagnosis not present

## 2018-07-10 DIAGNOSIS — H2513 Age-related nuclear cataract, bilateral: Secondary | ICD-10-CM | POA: Diagnosis not present

## 2018-07-10 DIAGNOSIS — H353132 Nonexudative age-related macular degeneration, bilateral, intermediate dry stage: Secondary | ICD-10-CM | POA: Diagnosis not present

## 2018-08-21 DIAGNOSIS — H353132 Nonexudative age-related macular degeneration, bilateral, intermediate dry stage: Secondary | ICD-10-CM | POA: Diagnosis not present

## 2018-08-21 DIAGNOSIS — H2513 Age-related nuclear cataract, bilateral: Secondary | ICD-10-CM | POA: Diagnosis not present

## 2018-08-21 DIAGNOSIS — H04123 Dry eye syndrome of bilateral lacrimal glands: Secondary | ICD-10-CM | POA: Diagnosis not present

## 2018-09-10 DIAGNOSIS — M25531 Pain in right wrist: Secondary | ICD-10-CM | POA: Diagnosis not present

## 2018-09-24 DIAGNOSIS — M25531 Pain in right wrist: Secondary | ICD-10-CM | POA: Diagnosis not present

## 2018-10-06 DIAGNOSIS — Z1159 Encounter for screening for other viral diseases: Secondary | ICD-10-CM | POA: Diagnosis not present

## 2018-10-06 DIAGNOSIS — Z0000 Encounter for general adult medical examination without abnormal findings: Secondary | ICD-10-CM | POA: Diagnosis not present

## 2018-10-06 DIAGNOSIS — E7800 Pure hypercholesterolemia, unspecified: Secondary | ICD-10-CM | POA: Diagnosis not present

## 2018-10-13 DIAGNOSIS — R52 Pain, unspecified: Secondary | ICD-10-CM | POA: Diagnosis not present

## 2018-10-13 DIAGNOSIS — R05 Cough: Secondary | ICD-10-CM | POA: Diagnosis not present

## 2018-11-09 IMAGING — DX DG CHEST 2V
2 series · 2 of 2 positions shown · non-contrast
Comparison: Chest radiograph March 25, 2016

CLINICAL DATA: Cough for 3 months.

EXAM:
CHEST  2 VIEW

[chest pa]
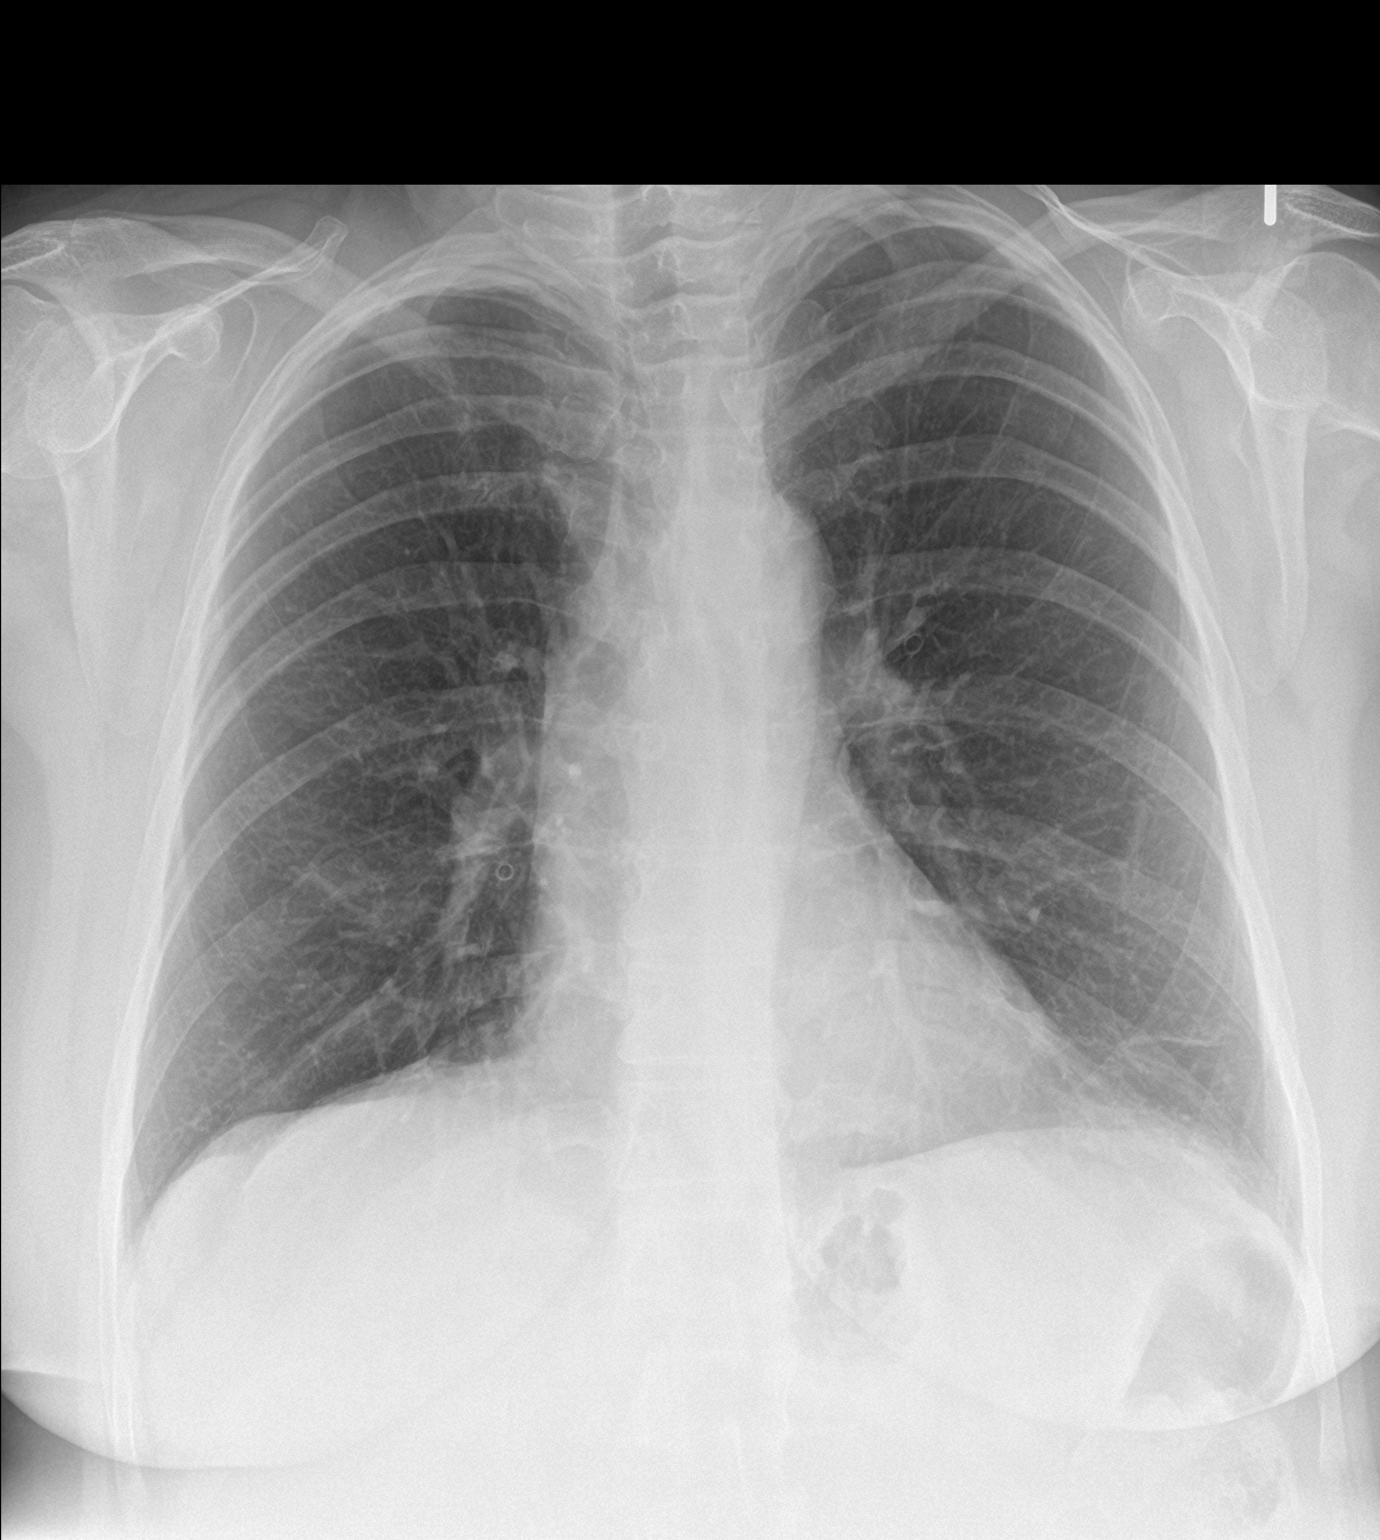

[chest lat]
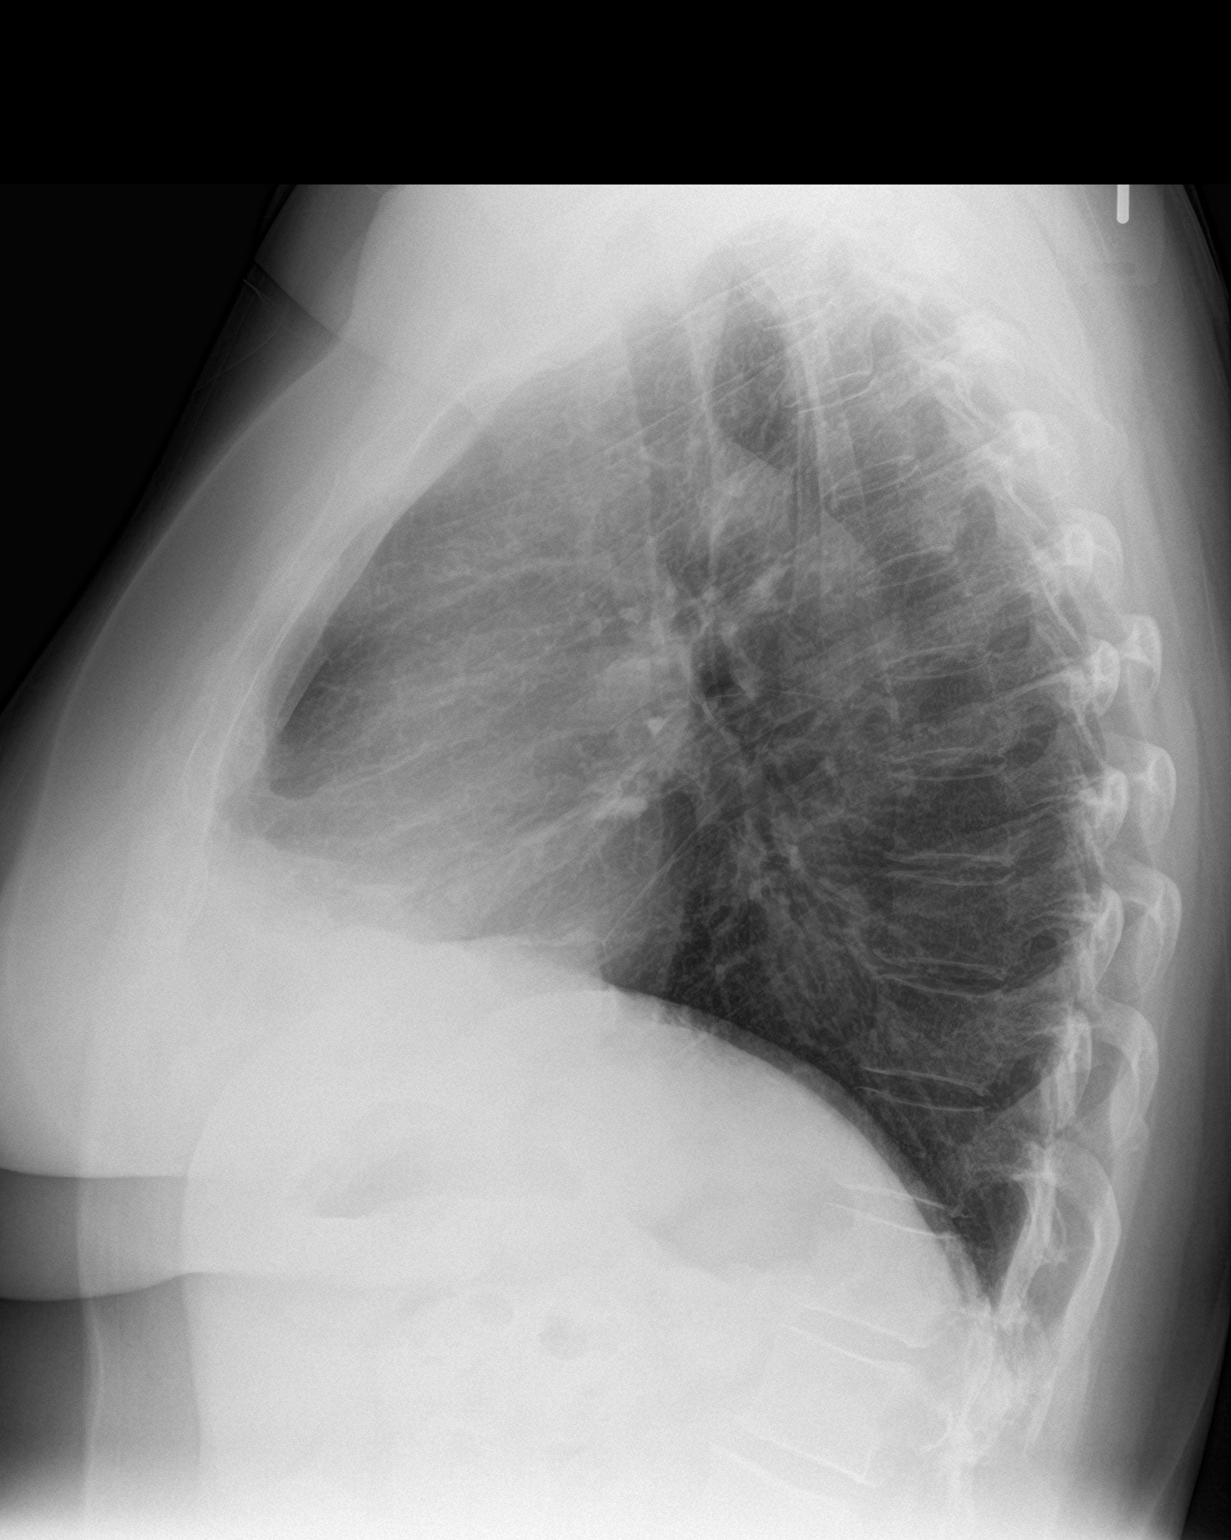

[2 of 2 positions shown; findings below may reference images not displayed]

FINDINGS: Cardiomediastinal silhouette is normal. No pleural effusions or
focal consolidations. Similar bibasilar strandy densities. Trachea
projects midline and there is no pneumothorax. Soft tissue planes
and included osseous structures are non-suspicious.
IMPRESSION: Similar bibasilar atelectasis/ scarring.

## 2019-02-26 ENCOUNTER — Ambulatory Visit
Admission: RE | Admit: 2019-02-26 | Discharge: 2019-02-26 | Disposition: A | Discharge status: Home / Self Care | Payer: Commercial Managed Care - PPO | Source: Ambulatory Visit | Attending: Family Medicine | Admitting: Family Medicine

## 2019-02-26 ENCOUNTER — Other Ambulatory Visit: Payer: Self-pay | Admitting: Family Medicine

## 2019-02-26 DIAGNOSIS — R05 Cough: Secondary | ICD-10-CM

## 2019-02-26 DIAGNOSIS — R059 Cough, unspecified: Secondary | ICD-10-CM

## 2019-02-26 DIAGNOSIS — Z77090 Contact with and (suspected) exposure to asbestos: Secondary | ICD-10-CM

## 2019-03-03 ENCOUNTER — Other Ambulatory Visit: Payer: Self-pay | Admitting: Family Medicine

## 2019-03-03 DIAGNOSIS — R9389 Abnormal findings on diagnostic imaging of other specified body structures: Secondary | ICD-10-CM

## 2019-03-11 ENCOUNTER — Ambulatory Visit
Admission: RE | Admit: 2019-03-11 | Discharge: 2019-03-11 | Disposition: A | Discharge status: Home / Self Care | Payer: Medicare HMO | Source: Ambulatory Visit | Attending: Family Medicine | Admitting: Family Medicine

## 2019-03-11 DIAGNOSIS — I712 Thoracic aortic aneurysm, without rupture: Secondary | ICD-10-CM | POA: Diagnosis not present

## 2019-03-11 DIAGNOSIS — R9389 Abnormal findings on diagnostic imaging of other specified body structures: Secondary | ICD-10-CM

## 2019-03-18 ENCOUNTER — Other Ambulatory Visit: Payer: Self-pay | Admitting: Family Medicine

## 2019-03-18 DIAGNOSIS — Z1231 Encounter for screening mammogram for malignant neoplasm of breast: Secondary | ICD-10-CM

## 2019-04-07 DIAGNOSIS — M25561 Pain in right knee: Secondary | ICD-10-CM | POA: Diagnosis not present

## 2019-04-07 DIAGNOSIS — M7121 Synovial cyst of popliteal space [Baker], right knee: Secondary | ICD-10-CM | POA: Diagnosis not present

## 2019-04-13 DIAGNOSIS — M25561 Pain in right knee: Secondary | ICD-10-CM | POA: Diagnosis not present

## 2019-04-13 DIAGNOSIS — M7121 Synovial cyst of popliteal space [Baker], right knee: Secondary | ICD-10-CM | POA: Diagnosis not present

## 2019-04-23 DIAGNOSIS — E7800 Pure hypercholesterolemia, unspecified: Secondary | ICD-10-CM | POA: Diagnosis not present

## 2019-04-27 DIAGNOSIS — M25561 Pain in right knee: Secondary | ICD-10-CM | POA: Diagnosis not present

## 2019-05-05 ENCOUNTER — Ambulatory Visit
Admission: RE | Admit: 2019-05-05 | Discharge: 2019-05-05 | Disposition: A | Discharge status: Home / Self Care | Payer: Medicare HMO | Source: Ambulatory Visit | Attending: Family Medicine | Admitting: Family Medicine

## 2019-05-05 ENCOUNTER — Other Ambulatory Visit: Payer: Self-pay

## 2019-05-05 DIAGNOSIS — Z1231 Encounter for screening mammogram for malignant neoplasm of breast: Secondary | ICD-10-CM | POA: Diagnosis not present

## 2019-05-13 DIAGNOSIS — H6120 Impacted cerumen, unspecified ear: Secondary | ICD-10-CM | POA: Diagnosis not present

## 2019-06-09 ENCOUNTER — Other Ambulatory Visit: Payer: Self-pay

## 2019-06-09 DIAGNOSIS — Z20822 Contact with and (suspected) exposure to covid-19: Secondary | ICD-10-CM

## 2019-06-10 LAB — NOVEL CORONAVIRUS, NAA: SARS-CoV-2, NAA: NOT DETECTED

## 2019-07-19 ENCOUNTER — Other Ambulatory Visit: Payer: Self-pay

## 2019-07-19 DIAGNOSIS — Z20822 Contact with and (suspected) exposure to covid-19: Secondary | ICD-10-CM

## 2019-07-20 LAB — NOVEL CORONAVIRUS, NAA: SARS-CoV-2, NAA: NOT DETECTED

## 2019-08-30 ENCOUNTER — Ambulatory Visit: Payer: Medicare HMO | Attending: Internal Medicine

## 2019-08-30 DIAGNOSIS — Z23 Encounter for immunization: Secondary | ICD-10-CM

## 2019-08-30 NOTE — Progress Notes (Signed)
   Covid-19 Vaccination Clinic  Name:  Cheyenne Miller    MRN: [id number] DOB: [birth year]  08/30/2019  Ms. Cheyenne Miller was observed post Covid-19 immunization for 15 minutes without incidence. She was provided with Vaccine Information Sheet and instruction to access the V-Safe system.   Ms. Cheyenne Miller was instructed to call 911 with any severe reactions post vaccine: Marland Kitchen Difficulty breathing  . Swelling of your face and throat  . A fast heartbeat  . A bad rash all over your body  . Dizziness and weakness    Immunizations Administered    Name Date Dose VIS Date Route   Pfizer COVID-19 Vaccine 08/30/2019  2:41 PM 0.3 mL 07/16/2019 Intramuscular   Manufacturer: [place]   Lot: [id number]   [place]: [id number]

## 2019-08-31 ENCOUNTER — Ambulatory Visit: Payer: Medicare HMO

## 2019-09-07 DIAGNOSIS — H353132 Nonexudative age-related macular degeneration, bilateral, intermediate dry stage: Secondary | ICD-10-CM | POA: Diagnosis not present

## 2019-09-07 DIAGNOSIS — H43813 Vitreous degeneration, bilateral: Secondary | ICD-10-CM | POA: Diagnosis not present

## 2019-09-07 DIAGNOSIS — H2513 Age-related nuclear cataract, bilateral: Secondary | ICD-10-CM | POA: Diagnosis not present

## 2019-09-07 DIAGNOSIS — D3132 Benign neoplasm of left choroid: Secondary | ICD-10-CM | POA: Diagnosis not present

## 2019-09-17 ENCOUNTER — Ambulatory Visit: Payer: Medicare HMO | Attending: Internal Medicine

## 2019-09-17 DIAGNOSIS — Z23 Encounter for immunization: Secondary | ICD-10-CM

## 2019-09-17 NOTE — Progress Notes (Signed)
   Covid-19 Vaccination Clinic  Name:  ALECHIA COUSINEAU    MRN: [id number] DOB: 12/07/1955  09/17/2019  Ms. Vigna was observed post Covid-19 immunization for 15 minutes without incidence. She was provided with Vaccine Information Sheet and instruction to access the V-Safe system.   Ms. Denaro was instructed to call 911 with any severe reactions post vaccine: Marland Kitchen Difficulty breathing  . Swelling of your face and throat  . A fast heartbeat  . A bad rash all over your body  . Dizziness and weakness    Immunizations Administered    Name Date Dose VIS Date Route   Pfizer COVID-19 Vaccine 09/17/2019 12:29 PM 0.3 mL 07/16/2019 Intramuscular   Manufacturer: [place]   Lot: [id number]   [place]: [id number]

## 2019-09-25 DIAGNOSIS — Z0100 Encounter for examination of eyes and vision without abnormal findings: Secondary | ICD-10-CM | POA: Diagnosis not present

## 2019-10-29 DIAGNOSIS — K219 Gastro-esophageal reflux disease without esophagitis: Secondary | ICD-10-CM | POA: Diagnosis not present

## 2019-10-29 DIAGNOSIS — Z23 Encounter for immunization: Secondary | ICD-10-CM | POA: Diagnosis not present

## 2019-10-29 DIAGNOSIS — G43109 Migraine with aura, not intractable, without status migrainosus: Secondary | ICD-10-CM | POA: Diagnosis not present

## 2019-10-29 DIAGNOSIS — K589 Irritable bowel syndrome without diarrhea: Secondary | ICD-10-CM | POA: Diagnosis not present

## 2019-10-29 DIAGNOSIS — R69 Illness, unspecified: Secondary | ICD-10-CM | POA: Diagnosis not present

## 2019-10-29 DIAGNOSIS — Z0000 Encounter for general adult medical examination without abnormal findings: Secondary | ICD-10-CM | POA: Diagnosis not present

## 2019-10-29 DIAGNOSIS — E7800 Pure hypercholesterolemia, unspecified: Secondary | ICD-10-CM | POA: Diagnosis not present

## 2019-11-17 DIAGNOSIS — M25561 Pain in right knee: Secondary | ICD-10-CM | POA: Diagnosis not present

## 2019-11-22 DIAGNOSIS — M1711 Unilateral primary osteoarthritis, right knee: Secondary | ICD-10-CM | POA: Diagnosis not present

## 2019-11-22 DIAGNOSIS — M25561 Pain in right knee: Secondary | ICD-10-CM | POA: Diagnosis not present

## 2019-12-07 DIAGNOSIS — M25561 Pain in right knee: Secondary | ICD-10-CM | POA: Diagnosis not present

## 2019-12-07 DIAGNOSIS — M1711 Unilateral primary osteoarthritis, right knee: Secondary | ICD-10-CM | POA: Diagnosis not present

## 2019-12-14 DIAGNOSIS — M1711 Unilateral primary osteoarthritis, right knee: Secondary | ICD-10-CM | POA: Diagnosis not present

## 2019-12-24 DIAGNOSIS — M1711 Unilateral primary osteoarthritis, right knee: Secondary | ICD-10-CM | POA: Diagnosis not present

## 2019-12-24 DIAGNOSIS — M25561 Pain in right knee: Secondary | ICD-10-CM | POA: Diagnosis not present

## 2019-12-27 DIAGNOSIS — D485 Neoplasm of uncertain behavior of skin: Secondary | ICD-10-CM | POA: Diagnosis not present

## 2019-12-27 DIAGNOSIS — L570 Actinic keratosis: Secondary | ICD-10-CM | POA: Diagnosis not present

## 2020-02-14 DIAGNOSIS — L821 Other seborrheic keratosis: Secondary | ICD-10-CM | POA: Diagnosis not present

## 2020-02-14 DIAGNOSIS — D2230 Melanocytic nevi of unspecified part of face: Secondary | ICD-10-CM | POA: Diagnosis not present

## 2020-02-14 DIAGNOSIS — L578 Other skin changes due to chronic exposure to nonionizing radiation: Secondary | ICD-10-CM | POA: Diagnosis not present

## 2020-02-14 DIAGNOSIS — D225 Melanocytic nevi of trunk: Secondary | ICD-10-CM | POA: Diagnosis not present

## 2020-02-14 DIAGNOSIS — L814 Other melanin hyperpigmentation: Secondary | ICD-10-CM | POA: Diagnosis not present

## 2020-02-14 DIAGNOSIS — L820 Inflamed seborrheic keratosis: Secondary | ICD-10-CM | POA: Diagnosis not present

## 2020-02-14 DIAGNOSIS — L570 Actinic keratosis: Secondary | ICD-10-CM | POA: Diagnosis not present

## 2020-03-23 ENCOUNTER — Other Ambulatory Visit: Payer: Self-pay | Admitting: Family Medicine

## 2020-03-23 DIAGNOSIS — R911 Solitary pulmonary nodule: Secondary | ICD-10-CM

## 2020-03-23 DIAGNOSIS — Z77090 Contact with and (suspected) exposure to asbestos: Secondary | ICD-10-CM

## 2020-03-31 ENCOUNTER — Ambulatory Visit
Admission: RE | Admit: 2020-03-31 | Discharge: 2020-03-31 | Disposition: A | Discharge status: Home / Self Care | Payer: Medicare HMO | Source: Ambulatory Visit | Attending: Family Medicine | Admitting: Family Medicine

## 2020-03-31 DIAGNOSIS — I700 Atherosclerosis of aorta: Secondary | ICD-10-CM | POA: Diagnosis not present

## 2020-03-31 DIAGNOSIS — Z77090 Contact with and (suspected) exposure to asbestos: Secondary | ICD-10-CM

## 2020-03-31 DIAGNOSIS — R911 Solitary pulmonary nodule: Secondary | ICD-10-CM

## 2020-03-31 DIAGNOSIS — J984 Other disorders of lung: Secondary | ICD-10-CM | POA: Diagnosis not present

## 2020-03-31 DIAGNOSIS — R918 Other nonspecific abnormal finding of lung field: Secondary | ICD-10-CM | POA: Diagnosis not present

## 2020-04-11 ENCOUNTER — Other Ambulatory Visit: Payer: Self-pay | Admitting: Family Medicine

## 2020-04-11 DIAGNOSIS — Z1231 Encounter for screening mammogram for malignant neoplasm of breast: Secondary | ICD-10-CM

## 2020-04-13 DIAGNOSIS — U071 COVID-19: Secondary | ICD-10-CM | POA: Diagnosis not present

## 2020-04-13 DIAGNOSIS — Z20822 Contact with and (suspected) exposure to covid-19: Secondary | ICD-10-CM | POA: Diagnosis not present

## 2020-05-05 ENCOUNTER — Ambulatory Visit: Payer: Medicare HMO

## 2020-05-12 ENCOUNTER — Ambulatory Visit: Payer: Medicare HMO

## 2020-05-16 DIAGNOSIS — Z23 Encounter for immunization: Secondary | ICD-10-CM | POA: Diagnosis not present

## 2020-05-16 DIAGNOSIS — Z7189 Other specified counseling: Secondary | ICD-10-CM | POA: Diagnosis not present

## 2020-05-16 DIAGNOSIS — E7800 Pure hypercholesterolemia, unspecified: Secondary | ICD-10-CM | POA: Diagnosis not present

## 2020-05-29 ENCOUNTER — Ambulatory Visit
Admission: RE | Admit: 2020-05-29 | Discharge: 2020-05-29 | Disposition: A | Discharge status: Home / Self Care | Payer: Medicare HMO | Source: Ambulatory Visit | Attending: Family Medicine | Admitting: Family Medicine

## 2020-05-29 ENCOUNTER — Other Ambulatory Visit: Payer: Self-pay

## 2020-05-29 DIAGNOSIS — Z1231 Encounter for screening mammogram for malignant neoplasm of breast: Secondary | ICD-10-CM | POA: Diagnosis not present

## 2020-05-30 DIAGNOSIS — H353132 Nonexudative age-related macular degeneration, bilateral, intermediate dry stage: Secondary | ICD-10-CM | POA: Diagnosis not present

## 2020-05-30 DIAGNOSIS — H43813 Vitreous degeneration, bilateral: Secondary | ICD-10-CM | POA: Diagnosis not present

## 2020-05-30 DIAGNOSIS — H2513 Age-related nuclear cataract, bilateral: Secondary | ICD-10-CM | POA: Diagnosis not present

## 2020-05-30 DIAGNOSIS — D3132 Benign neoplasm of left choroid: Secondary | ICD-10-CM | POA: Diagnosis not present

## 2020-06-07 DIAGNOSIS — M1711 Unilateral primary osteoarthritis, right knee: Secondary | ICD-10-CM | POA: Diagnosis not present

## 2020-06-07 DIAGNOSIS — M25561 Pain in right knee: Secondary | ICD-10-CM | POA: Diagnosis not present

## 2020-07-06 DIAGNOSIS — S83231A Complex tear of medial meniscus, current injury, right knee, initial encounter: Secondary | ICD-10-CM | POA: Diagnosis not present

## 2020-08-14 DIAGNOSIS — Z1159 Encounter for screening for other viral diseases: Secondary | ICD-10-CM | POA: Diagnosis not present

## 2020-08-29 DIAGNOSIS — Z1211 Encounter for screening for malignant neoplasm of colon: Secondary | ICD-10-CM | POA: Diagnosis not present

## 2020-08-29 DIAGNOSIS — Z86010 Personal history of colonic polyps: Secondary | ICD-10-CM | POA: Diagnosis not present

## 2020-08-29 DIAGNOSIS — K219 Gastro-esophageal reflux disease without esophagitis: Secondary | ICD-10-CM | POA: Diagnosis not present

## 2020-08-29 DIAGNOSIS — E669 Obesity, unspecified: Secondary | ICD-10-CM | POA: Diagnosis not present

## 2020-09-29 DIAGNOSIS — D122 Benign neoplasm of ascending colon: Secondary | ICD-10-CM | POA: Diagnosis not present

## 2020-09-29 DIAGNOSIS — K317 Polyp of stomach and duodenum: Secondary | ICD-10-CM | POA: Diagnosis not present

## 2020-09-29 DIAGNOSIS — K635 Polyp of colon: Secondary | ICD-10-CM | POA: Diagnosis not present

## 2020-09-29 DIAGNOSIS — K319 Disease of stomach and duodenum, unspecified: Secondary | ICD-10-CM | POA: Diagnosis not present

## 2020-09-29 DIAGNOSIS — Z1211 Encounter for screening for malignant neoplasm of colon: Secondary | ICD-10-CM | POA: Diagnosis not present

## 2020-09-29 DIAGNOSIS — K259 Gastric ulcer, unspecified as acute or chronic, without hemorrhage or perforation: Secondary | ICD-10-CM | POA: Diagnosis not present

## 2020-09-29 DIAGNOSIS — K6389 Other specified diseases of intestine: Secondary | ICD-10-CM | POA: Diagnosis not present

## 2020-09-29 DIAGNOSIS — Z86010 Personal history of colonic polyps: Secondary | ICD-10-CM | POA: Diagnosis not present

## 2020-09-29 DIAGNOSIS — D123 Benign neoplasm of transverse colon: Secondary | ICD-10-CM | POA: Diagnosis not present

## 2020-09-29 DIAGNOSIS — K219 Gastro-esophageal reflux disease without esophagitis: Secondary | ICD-10-CM | POA: Diagnosis not present

## 2020-10-06 DIAGNOSIS — S9032XA Contusion of left foot, initial encounter: Secondary | ICD-10-CM | POA: Diagnosis not present

## 2020-10-06 DIAGNOSIS — M79661 Pain in right lower leg: Secondary | ICD-10-CM | POA: Diagnosis not present

## 2020-10-06 DIAGNOSIS — W101XXA Fall (on)(from) sidewalk curb, initial encounter: Secondary | ICD-10-CM | POA: Diagnosis not present

## 2020-10-06 DIAGNOSIS — S0993XA Unspecified injury of face, initial encounter: Secondary | ICD-10-CM | POA: Diagnosis not present

## 2020-10-06 DIAGNOSIS — M1711 Unilateral primary osteoarthritis, right knee: Secondary | ICD-10-CM | POA: Diagnosis not present

## 2020-10-11 DIAGNOSIS — M25561 Pain in right knee: Secondary | ICD-10-CM | POA: Diagnosis not present

## 2020-10-17 DIAGNOSIS — M25561 Pain in right knee: Secondary | ICD-10-CM | POA: Diagnosis not present

## 2020-10-31 DIAGNOSIS — M25561 Pain in right knee: Secondary | ICD-10-CM | POA: Diagnosis not present

## 2020-11-28 DIAGNOSIS — H2513 Age-related nuclear cataract, bilateral: Secondary | ICD-10-CM | POA: Diagnosis not present

## 2020-11-28 DIAGNOSIS — D3132 Benign neoplasm of left choroid: Secondary | ICD-10-CM | POA: Diagnosis not present

## 2020-11-28 DIAGNOSIS — H353132 Nonexudative age-related macular degeneration, bilateral, intermediate dry stage: Secondary | ICD-10-CM | POA: Diagnosis not present

## 2020-11-28 DIAGNOSIS — H43813 Vitreous degeneration, bilateral: Secondary | ICD-10-CM | POA: Diagnosis not present

## 2020-12-04 DIAGNOSIS — M1711 Unilateral primary osteoarthritis, right knee: Secondary | ICD-10-CM | POA: Diagnosis not present

## 2020-12-06 ENCOUNTER — Other Ambulatory Visit: Payer: Self-pay | Admitting: Family Medicine

## 2020-12-06 DIAGNOSIS — K589 Irritable bowel syndrome without diarrhea: Secondary | ICD-10-CM | POA: Diagnosis not present

## 2020-12-06 DIAGNOSIS — M8588 Other specified disorders of bone density and structure, other site: Secondary | ICD-10-CM | POA: Diagnosis not present

## 2020-12-06 DIAGNOSIS — G43109 Migraine with aura, not intractable, without status migrainosus: Secondary | ICD-10-CM | POA: Diagnosis not present

## 2020-12-06 DIAGNOSIS — E7800 Pure hypercholesterolemia, unspecified: Secondary | ICD-10-CM | POA: Diagnosis not present

## 2020-12-06 DIAGNOSIS — Z0000 Encounter for general adult medical examination without abnormal findings: Secondary | ICD-10-CM | POA: Diagnosis not present

## 2020-12-06 DIAGNOSIS — Z1159 Encounter for screening for other viral diseases: Secondary | ICD-10-CM | POA: Diagnosis not present

## 2020-12-06 DIAGNOSIS — B0229 Other postherpetic nervous system involvement: Secondary | ICD-10-CM | POA: Diagnosis not present

## 2020-12-06 DIAGNOSIS — R69 Illness, unspecified: Secondary | ICD-10-CM | POA: Diagnosis not present

## 2020-12-06 DIAGNOSIS — Z1231 Encounter for screening mammogram for malignant neoplasm of breast: Secondary | ICD-10-CM

## 2020-12-06 DIAGNOSIS — K219 Gastro-esophageal reflux disease without esophagitis: Secondary | ICD-10-CM | POA: Diagnosis not present

## 2021-01-04 DIAGNOSIS — M1711 Unilateral primary osteoarthritis, right knee: Secondary | ICD-10-CM | POA: Diagnosis not present

## 2021-01-11 DIAGNOSIS — M1711 Unilateral primary osteoarthritis, right knee: Secondary | ICD-10-CM | POA: Diagnosis not present

## 2021-01-18 DIAGNOSIS — M1711 Unilateral primary osteoarthritis, right knee: Secondary | ICD-10-CM | POA: Diagnosis not present

## 2021-02-14 DIAGNOSIS — L814 Other melanin hyperpigmentation: Secondary | ICD-10-CM | POA: Diagnosis not present

## 2021-02-14 DIAGNOSIS — D2230 Melanocytic nevi of unspecified part of face: Secondary | ICD-10-CM | POA: Diagnosis not present

## 2021-02-14 DIAGNOSIS — L570 Actinic keratosis: Secondary | ICD-10-CM | POA: Diagnosis not present

## 2021-02-14 DIAGNOSIS — D225 Melanocytic nevi of trunk: Secondary | ICD-10-CM | POA: Diagnosis not present

## 2021-02-14 DIAGNOSIS — L821 Other seborrheic keratosis: Secondary | ICD-10-CM | POA: Diagnosis not present

## 2021-02-14 DIAGNOSIS — L578 Other skin changes due to chronic exposure to nonionizing radiation: Secondary | ICD-10-CM | POA: Diagnosis not present

## 2021-03-15 DIAGNOSIS — M79641 Pain in right hand: Secondary | ICD-10-CM | POA: Diagnosis not present

## 2021-03-15 DIAGNOSIS — M1711 Unilateral primary osteoarthritis, right knee: Secondary | ICD-10-CM | POA: Diagnosis not present

## 2021-05-08 DIAGNOSIS — M1711 Unilateral primary osteoarthritis, right knee: Secondary | ICD-10-CM | POA: Diagnosis not present

## 2021-06-01 ENCOUNTER — Ambulatory Visit
Admission: RE | Admit: 2021-06-01 | Discharge: 2021-06-01 | Disposition: A | Discharge status: Home / Self Care | Payer: Medicare HMO | Source: Ambulatory Visit | Attending: Family Medicine | Admitting: Family Medicine

## 2021-06-01 ENCOUNTER — Other Ambulatory Visit: Payer: Self-pay

## 2021-06-01 DIAGNOSIS — Z1231 Encounter for screening mammogram for malignant neoplasm of breast: Secondary | ICD-10-CM | POA: Diagnosis not present

## 2021-06-01 DIAGNOSIS — Z780 Asymptomatic menopausal state: Secondary | ICD-10-CM | POA: Diagnosis not present

## 2021-06-01 DIAGNOSIS — M8588 Other specified disorders of bone density and structure, other site: Secondary | ICD-10-CM

## 2021-07-13 DIAGNOSIS — H2513 Age-related nuclear cataract, bilateral: Secondary | ICD-10-CM | POA: Diagnosis not present

## 2021-07-13 DIAGNOSIS — H353132 Nonexudative age-related macular degeneration, bilateral, intermediate dry stage: Secondary | ICD-10-CM | POA: Diagnosis not present

## 2021-07-13 DIAGNOSIS — H5213 Myopia, bilateral: Secondary | ICD-10-CM | POA: Diagnosis not present

## 2021-07-17 IMAGING — CT CT CHEST WITHOUT CONTRAST
2 of 4 series · 11 of 36 positions shown, 13 images · non-contrast
Comparison: Chest radiograph 02/26/2019

CLINICAL DATA: Abnormal chest radiograph, question lung nodule LEFT
base, history of asbestos in house

EXAM:
CT CHEST WITHOUT CONTRAST
TECHNIQUE: Multidetector CT imaging of the chest was performed following the
standard protocol without IV contrast. Sagittal and coronal MPR
images reconstructed from axial data set.

[Series 2: chest 2.00 br40 s3 · axial · 0.53mm/px · z∈[+1455,+1685]mm · 8 of 137 slices shown, 10 images (1 of 2)]
[im 11/137  mediastinal]
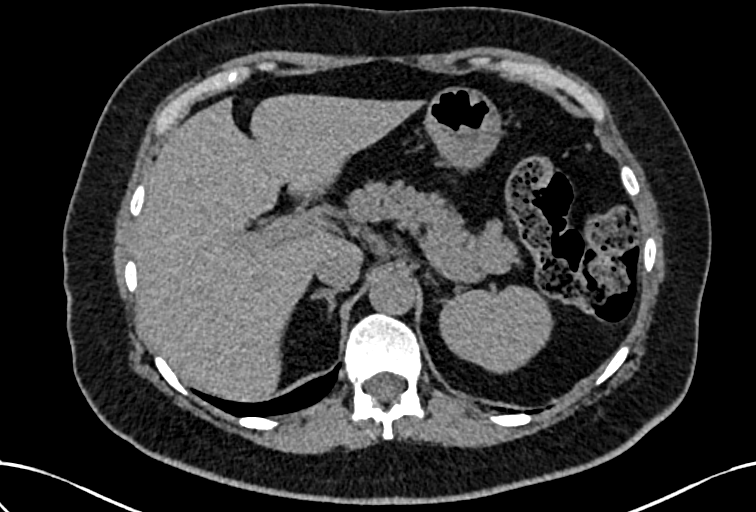
[im 11/137  lung]
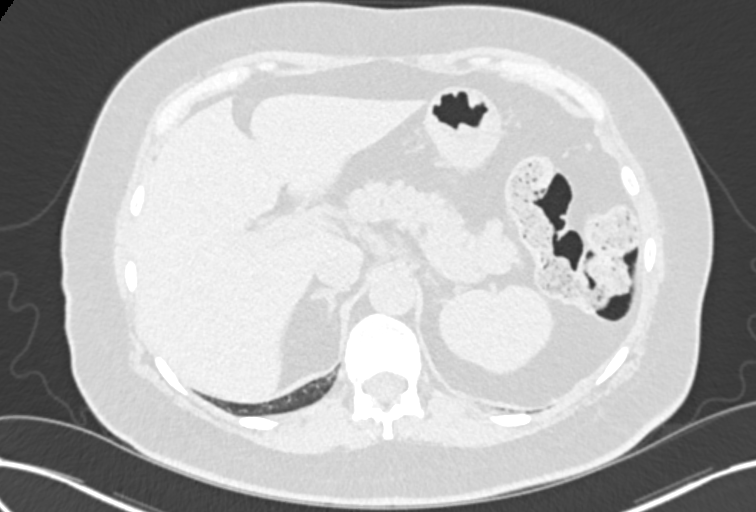
[im 32/137  lung]
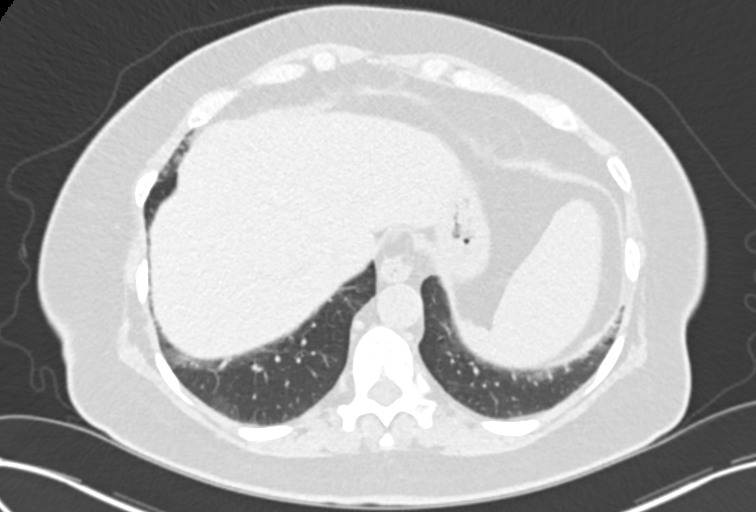
[im 42/137  lung]
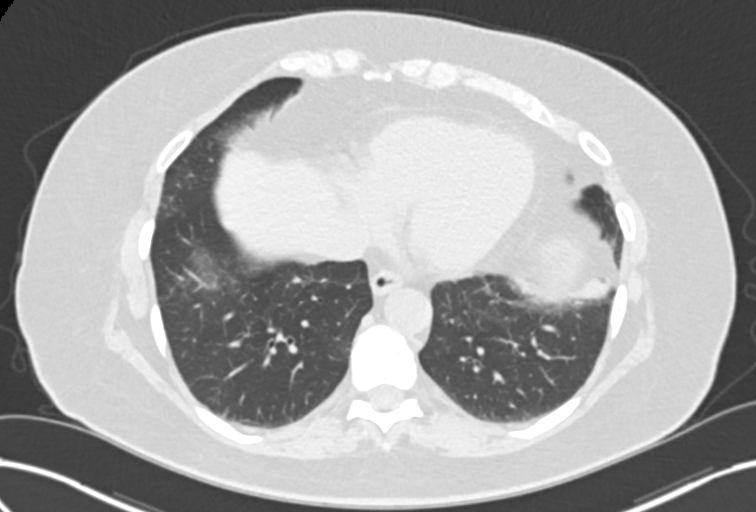
[im 63/137  lung]
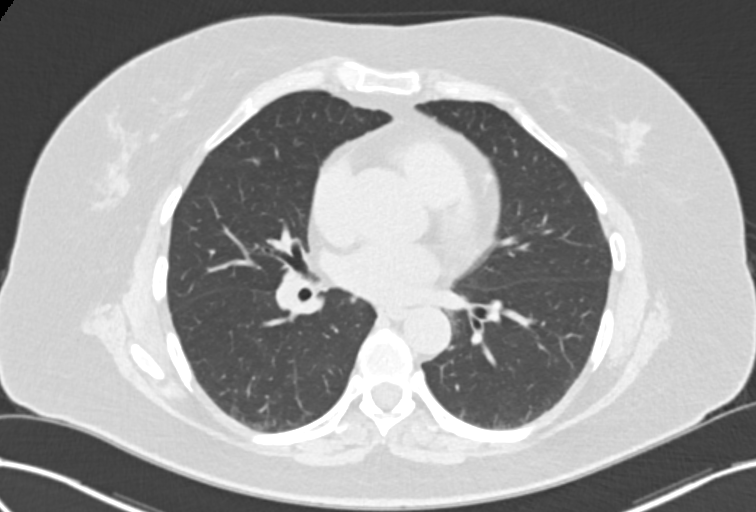
[im 74/137  mediastinal]
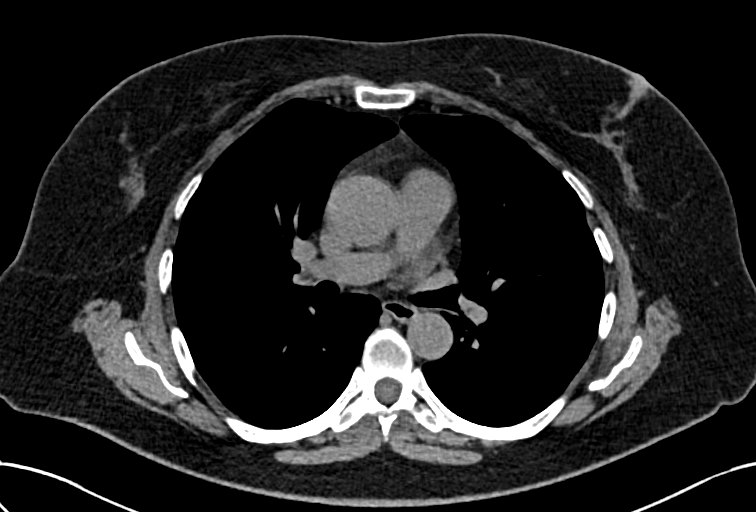
[im 74/137  lung]
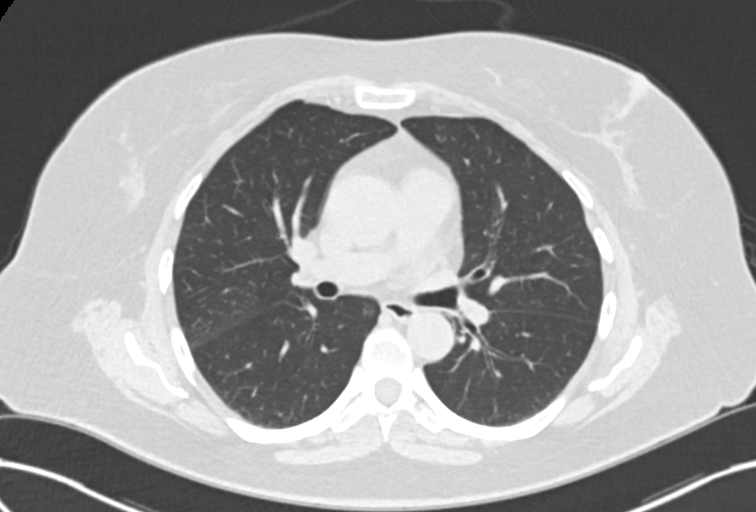
[im 95/137  lung]
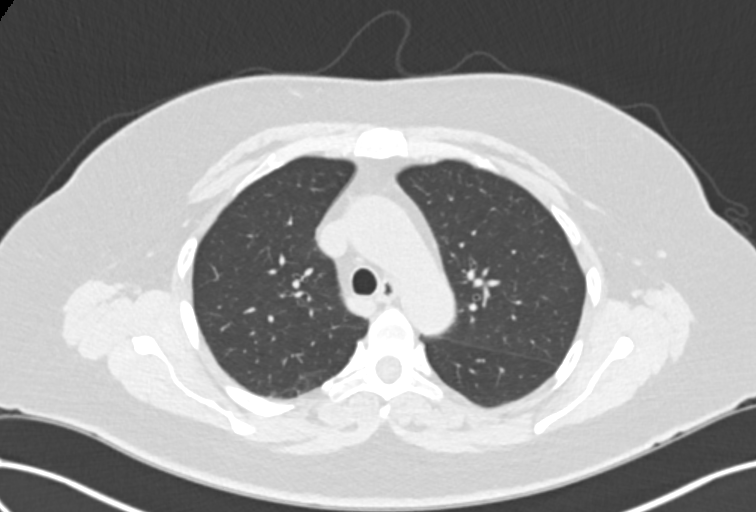
[im 105/137  lung]
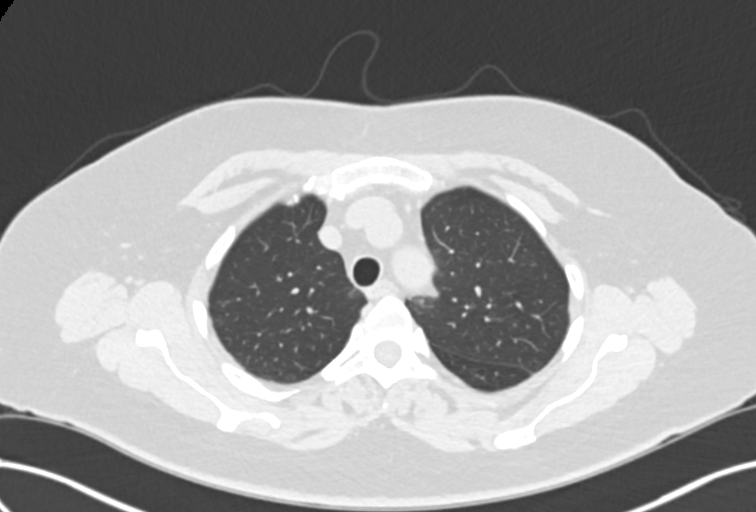
[im 126/137  lung]
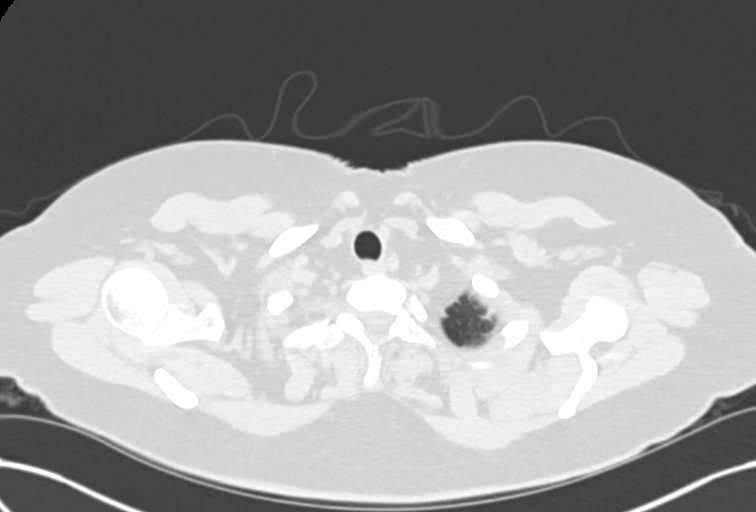

[Series 4: chest 2.00 br40 s3 · coronal · 0.54mm/px · 3 of 136 slices shown (2 of 2)]
[im 28/136  lung]
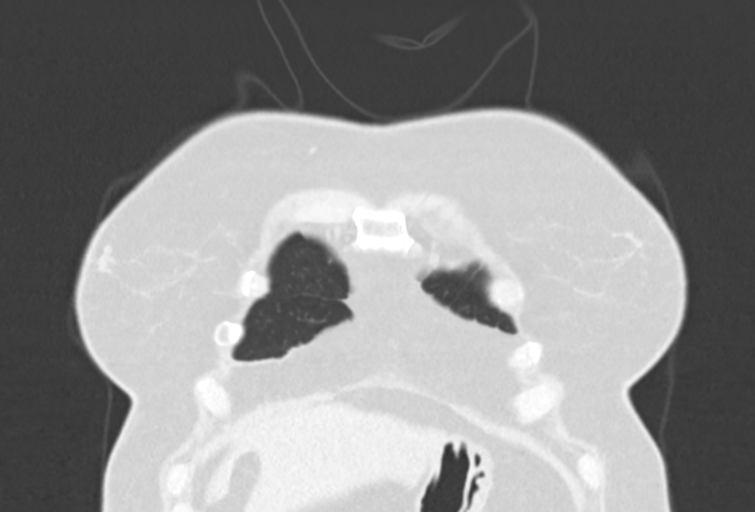
[im 55/136  lung]
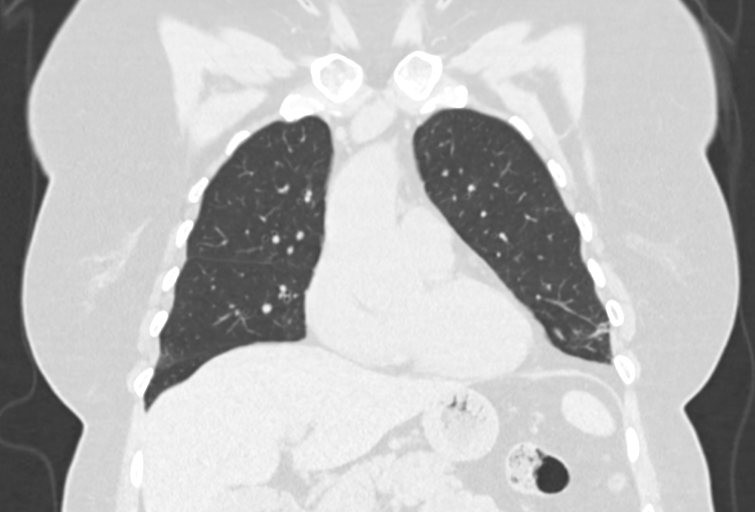
[im 82/136  lung]
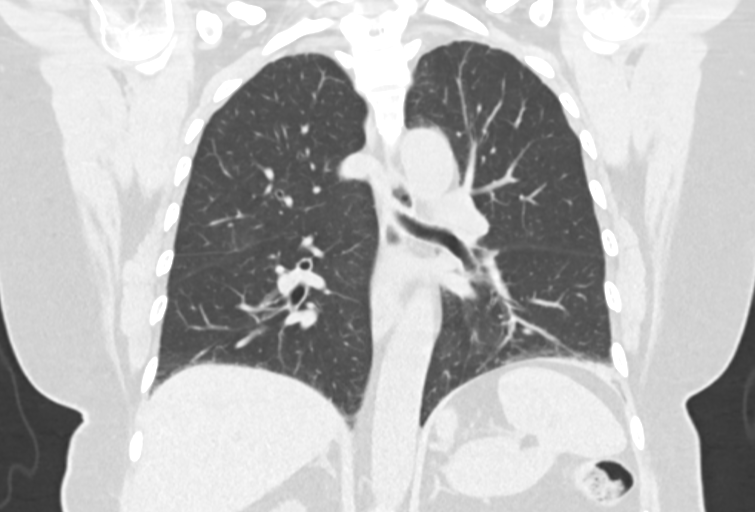

[11 of 36 positions shown; findings below may reference images not displayed]

FINDINGS: Cardiovascular: Upper normal caliber ascending thoracic aorta 3.7 cm
diameter. Heart normal size. No pericardial effusion. No
atherosclerotic calcification seen.

Mediastinum/Nodes: Base of cervical region normal appearance.
Esophagus unremarkable. No thoracic adenopathy.

Lungs/Pleura: 4 mm LEFT upper lobe nodule image 39. Additional tiny
BILATERAL nonspecific pulmonary nodules. Focal area of atelectasis
at lateral aspect base of lingula, centered image 89, likely
accounts for chest radiograph finding. Minimal bibasilar atelectasis
in remaining lungs. No acute infiltrate, pleural effusion or
pneumothorax.

Upper Abdomen: Visualized upper abdomen unremarkable

Musculoskeletal: No acute osseous findings.
IMPRESSION: Subsegmental atelectasis at lingula, likely accounting for chest
radiograph finding.

Tiny BILATERAL pulmonary nodules largest 4 mm LEFT upper lobe,
recommendation below.

No follow-up needed if patient is low-risk (and has no known or
suspected primary neoplasm). Non-contrast chest CT can be considered
in 12 months if patient is high-risk. This recommendation follows
the consensus statement: Guidelines for Management of Incidental
Pulmonary Nodules Detected on CT Images: From the [HOSPITAL]

## 2021-10-15 DIAGNOSIS — Z0100 Encounter for examination of eyes and vision without abnormal findings: Secondary | ICD-10-CM | POA: Diagnosis not present

## 2022-02-11 ENCOUNTER — Other Ambulatory Visit: Payer: Self-pay | Admitting: Internal Medicine

## 2022-02-11 DIAGNOSIS — Z77090 Contact with and (suspected) exposure to asbestos: Secondary | ICD-10-CM | POA: Diagnosis not present

## 2022-02-11 DIAGNOSIS — R69 Illness, unspecified: Secondary | ICD-10-CM | POA: Diagnosis not present

## 2022-02-11 DIAGNOSIS — K219 Gastro-esophageal reflux disease without esophagitis: Secondary | ICD-10-CM | POA: Diagnosis not present

## 2022-02-11 DIAGNOSIS — R911 Solitary pulmonary nodule: Secondary | ICD-10-CM | POA: Diagnosis not present

## 2022-02-11 DIAGNOSIS — Z8249 Family history of ischemic heart disease and other diseases of the circulatory system: Secondary | ICD-10-CM

## 2022-02-11 DIAGNOSIS — I700 Atherosclerosis of aorta: Secondary | ICD-10-CM | POA: Diagnosis not present

## 2022-02-11 DIAGNOSIS — E7800 Pure hypercholesterolemia, unspecified: Secondary | ICD-10-CM | POA: Diagnosis not present

## 2022-02-11 DIAGNOSIS — G43109 Migraine with aura, not intractable, without status migrainosus: Secondary | ICD-10-CM | POA: Diagnosis not present

## 2022-02-11 DIAGNOSIS — M1710 Unilateral primary osteoarthritis, unspecified knee: Secondary | ICD-10-CM | POA: Diagnosis not present

## 2022-02-11 DIAGNOSIS — G4719 Other hypersomnia: Secondary | ICD-10-CM | POA: Diagnosis not present

## 2022-02-11 DIAGNOSIS — Z23 Encounter for immunization: Secondary | ICD-10-CM | POA: Diagnosis not present

## 2022-02-11 DIAGNOSIS — Z0000 Encounter for general adult medical examination without abnormal findings: Secondary | ICD-10-CM | POA: Diagnosis not present

## 2022-02-27 DIAGNOSIS — D2230 Melanocytic nevi of unspecified part of face: Secondary | ICD-10-CM | POA: Diagnosis not present

## 2022-02-27 DIAGNOSIS — L814 Other melanin hyperpigmentation: Secondary | ICD-10-CM | POA: Diagnosis not present

## 2022-02-27 DIAGNOSIS — L821 Other seborrheic keratosis: Secondary | ICD-10-CM | POA: Diagnosis not present

## 2022-02-27 DIAGNOSIS — L578 Other skin changes due to chronic exposure to nonionizing radiation: Secondary | ICD-10-CM | POA: Diagnosis not present

## 2022-02-27 DIAGNOSIS — D225 Melanocytic nevi of trunk: Secondary | ICD-10-CM | POA: Diagnosis not present

## 2022-02-27 DIAGNOSIS — L570 Actinic keratosis: Secondary | ICD-10-CM | POA: Diagnosis not present

## 2022-03-14 DIAGNOSIS — G4733 Obstructive sleep apnea (adult) (pediatric): Secondary | ICD-10-CM | POA: Diagnosis not present

## 2022-03-15 DIAGNOSIS — M25562 Pain in left knee: Secondary | ICD-10-CM | POA: Diagnosis not present

## 2022-03-26 DIAGNOSIS — M25562 Pain in left knee: Secondary | ICD-10-CM | POA: Diagnosis not present

## 2022-03-26 DIAGNOSIS — M1711 Unilateral primary osteoarthritis, right knee: Secondary | ICD-10-CM | POA: Diagnosis not present

## 2022-03-27 DIAGNOSIS — G4733 Obstructive sleep apnea (adult) (pediatric): Secondary | ICD-10-CM | POA: Diagnosis not present

## 2022-04-04 DIAGNOSIS — M25562 Pain in left knee: Secondary | ICD-10-CM | POA: Diagnosis not present

## 2022-04-16 DIAGNOSIS — M1712 Unilateral primary osteoarthritis, left knee: Secondary | ICD-10-CM | POA: Diagnosis not present

## 2022-04-19 ENCOUNTER — Ambulatory Visit
Admission: RE | Admit: 2022-04-19 | Discharge: 2022-04-19 | Disposition: A | Discharge status: Home / Self Care | Payer: Medicare HMO | Source: Ambulatory Visit | Attending: Internal Medicine | Admitting: Internal Medicine

## 2022-04-19 DIAGNOSIS — Z8249 Family history of ischemic heart disease and other diseases of the circulatory system: Secondary | ICD-10-CM

## 2022-04-19 DIAGNOSIS — I2510 Atherosclerotic heart disease of native coronary artery without angina pectoris: Secondary | ICD-10-CM | POA: Diagnosis not present

## 2022-04-22 DIAGNOSIS — G4733 Obstructive sleep apnea (adult) (pediatric): Secondary | ICD-10-CM | POA: Diagnosis not present

## 2022-04-27 DIAGNOSIS — G4733 Obstructive sleep apnea (adult) (pediatric): Secondary | ICD-10-CM | POA: Diagnosis not present

## 2022-05-01 ENCOUNTER — Other Ambulatory Visit: Payer: Self-pay | Admitting: Family Medicine

## 2022-05-01 DIAGNOSIS — Z1231 Encounter for screening mammogram for malignant neoplasm of breast: Secondary | ICD-10-CM

## 2022-05-20 DIAGNOSIS — L820 Inflamed seborrheic keratosis: Secondary | ICD-10-CM | POA: Diagnosis not present

## 2022-05-27 DIAGNOSIS — G4733 Obstructive sleep apnea (adult) (pediatric): Secondary | ICD-10-CM | POA: Diagnosis not present

## 2022-05-28 DIAGNOSIS — H353221 Exudative age-related macular degeneration, left eye, with active choroidal neovascularization: Secondary | ICD-10-CM | POA: Diagnosis not present

## 2022-05-28 DIAGNOSIS — H2512 Age-related nuclear cataract, left eye: Secondary | ICD-10-CM | POA: Diagnosis not present

## 2022-06-04 ENCOUNTER — Ambulatory Visit: Payer: Medicare HMO

## 2022-06-04 ENCOUNTER — Encounter (INDEPENDENT_AMBULATORY_CARE_PROVIDER_SITE_OTHER): Payer: Medicare HMO | Admitting: Ophthalmology

## 2022-06-04 DIAGNOSIS — H43813 Vitreous degeneration, bilateral: Secondary | ICD-10-CM | POA: Diagnosis not present

## 2022-06-04 DIAGNOSIS — H353221 Exudative age-related macular degeneration, left eye, with active choroidal neovascularization: Secondary | ICD-10-CM

## 2022-06-04 DIAGNOSIS — H353112 Nonexudative age-related macular degeneration, right eye, intermediate dry stage: Secondary | ICD-10-CM | POA: Diagnosis not present

## 2022-06-04 DIAGNOSIS — H33302 Unspecified retinal break, left eye: Secondary | ICD-10-CM

## 2022-06-11 DIAGNOSIS — M170 Bilateral primary osteoarthritis of knee: Secondary | ICD-10-CM | POA: Diagnosis not present

## 2022-06-11 DIAGNOSIS — M1712 Unilateral primary osteoarthritis, left knee: Secondary | ICD-10-CM | POA: Diagnosis not present

## 2022-06-11 DIAGNOSIS — S83412D Sprain of medial collateral ligament of left knee, subsequent encounter: Secondary | ICD-10-CM | POA: Diagnosis not present

## 2022-06-11 DIAGNOSIS — M1711 Unilateral primary osteoarthritis, right knee: Secondary | ICD-10-CM | POA: Diagnosis not present

## 2022-06-14 ENCOUNTER — Encounter (INDEPENDENT_AMBULATORY_CARE_PROVIDER_SITE_OTHER): Payer: Medicare HMO | Admitting: Ophthalmology

## 2022-06-14 DIAGNOSIS — H353221 Exudative age-related macular degeneration, left eye, with active choroidal neovascularization: Secondary | ICD-10-CM

## 2022-06-14 DIAGNOSIS — H43813 Vitreous degeneration, bilateral: Secondary | ICD-10-CM | POA: Diagnosis not present

## 2022-06-14 DIAGNOSIS — H353112 Nonexudative age-related macular degeneration, right eye, intermediate dry stage: Secondary | ICD-10-CM

## 2022-06-14 DIAGNOSIS — H33302 Unspecified retinal break, left eye: Secondary | ICD-10-CM

## 2022-06-18 DIAGNOSIS — G4733 Obstructive sleep apnea (adult) (pediatric): Secondary | ICD-10-CM | POA: Diagnosis not present

## 2022-06-27 DIAGNOSIS — G4733 Obstructive sleep apnea (adult) (pediatric): Secondary | ICD-10-CM | POA: Diagnosis not present

## 2022-07-02 ENCOUNTER — Ambulatory Visit: Payer: Medicare HMO

## 2022-07-02 DIAGNOSIS — M170 Bilateral primary osteoarthritis of knee: Secondary | ICD-10-CM | POA: Diagnosis not present

## 2022-07-04 ENCOUNTER — Encounter (INDEPENDENT_AMBULATORY_CARE_PROVIDER_SITE_OTHER): Payer: Medicare HMO | Admitting: Ophthalmology

## 2022-07-04 DIAGNOSIS — H43813 Vitreous degeneration, bilateral: Secondary | ICD-10-CM

## 2022-07-04 DIAGNOSIS — H353112 Nonexudative age-related macular degeneration, right eye, intermediate dry stage: Secondary | ICD-10-CM

## 2022-07-04 DIAGNOSIS — H33302 Unspecified retinal break, left eye: Secondary | ICD-10-CM | POA: Diagnosis not present

## 2022-07-04 DIAGNOSIS — H353221 Exudative age-related macular degeneration, left eye, with active choroidal neovascularization: Secondary | ICD-10-CM

## 2022-07-04 DIAGNOSIS — G4733 Obstructive sleep apnea (adult) (pediatric): Secondary | ICD-10-CM | POA: Diagnosis not present

## 2022-07-08 DIAGNOSIS — G4733 Obstructive sleep apnea (adult) (pediatric): Secondary | ICD-10-CM | POA: Diagnosis not present

## 2022-07-09 ENCOUNTER — Ambulatory Visit
Admission: RE | Admit: 2022-07-09 | Discharge: 2022-07-09 | Disposition: A | Discharge status: Home / Self Care | Payer: Medicare HMO | Source: Ambulatory Visit | Attending: Family Medicine | Admitting: Family Medicine

## 2022-07-09 DIAGNOSIS — M1711 Unilateral primary osteoarthritis, right knee: Secondary | ICD-10-CM | POA: Diagnosis not present

## 2022-07-09 DIAGNOSIS — M1712 Unilateral primary osteoarthritis, left knee: Secondary | ICD-10-CM | POA: Diagnosis not present

## 2022-07-09 DIAGNOSIS — Z1231 Encounter for screening mammogram for malignant neoplasm of breast: Secondary | ICD-10-CM | POA: Diagnosis not present

## 2022-07-16 DIAGNOSIS — M170 Bilateral primary osteoarthritis of knee: Secondary | ICD-10-CM | POA: Diagnosis not present

## 2022-07-16 DIAGNOSIS — M1712 Unilateral primary osteoarthritis, left knee: Secondary | ICD-10-CM | POA: Diagnosis not present

## 2022-07-16 DIAGNOSIS — M1711 Unilateral primary osteoarthritis, right knee: Secondary | ICD-10-CM | POA: Diagnosis not present

## 2022-07-17 DIAGNOSIS — H5203 Hypermetropia, bilateral: Secondary | ICD-10-CM | POA: Diagnosis not present

## 2022-07-17 DIAGNOSIS — H2513 Age-related nuclear cataract, bilateral: Secondary | ICD-10-CM | POA: Diagnosis not present

## 2022-07-17 DIAGNOSIS — H04123 Dry eye syndrome of bilateral lacrimal glands: Secondary | ICD-10-CM | POA: Diagnosis not present

## 2022-07-17 DIAGNOSIS — H353222 Exudative age-related macular degeneration, left eye, with inactive choroidal neovascularization: Secondary | ICD-10-CM | POA: Diagnosis not present

## 2022-07-17 DIAGNOSIS — H25013 Cortical age-related cataract, bilateral: Secondary | ICD-10-CM | POA: Diagnosis not present

## 2022-07-27 DIAGNOSIS — G4733 Obstructive sleep apnea (adult) (pediatric): Secondary | ICD-10-CM | POA: Diagnosis not present

## 2022-08-07 IMAGING — CT CT CHEST W/O CM
2 of 4 series · 11 of 36 positions shown, 13 images · non-contrast
Comparison: Chest CT 03/11/2019

CLINICAL DATA: Follow-up lung nodule.  No spasticity exposure.

EXAM:
CT CHEST WITHOUT CONTRAST
TECHNIQUE: Multidetector CT imaging of the chest was performed following the
standard protocol without IV contrast.

[Series 2: chest 2.00 br40 s3 · axial · 0.57mm/px · z∈[+1622,+1869]mm · 8 of 148 slices shown, 10 images (1 of 2)]
[im 12/148  mediastinal]
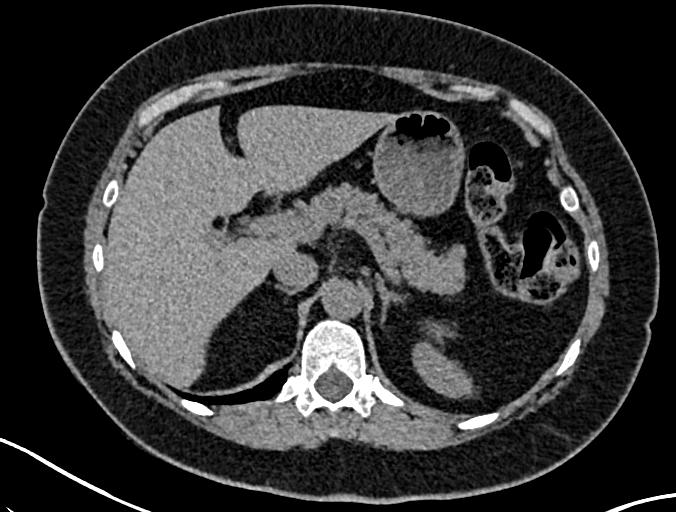
[im 12/148  lung]
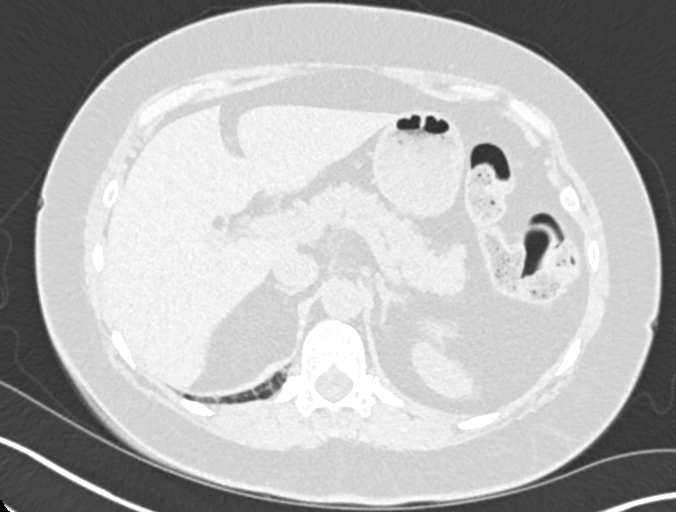
[im 34/148  lung]
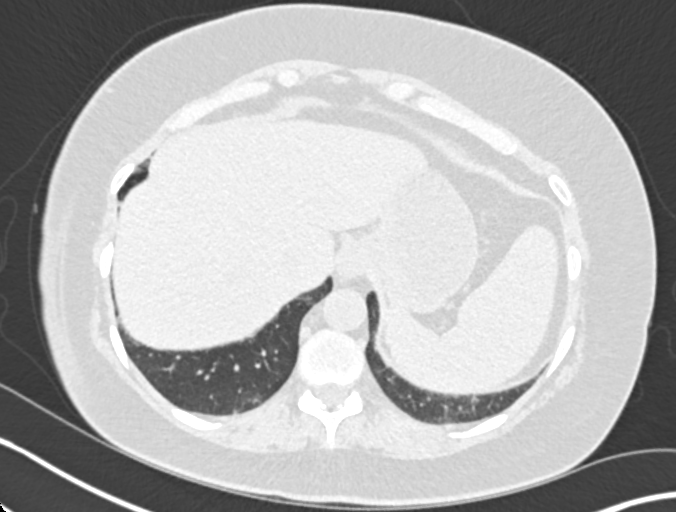
[im 46/148  lung]
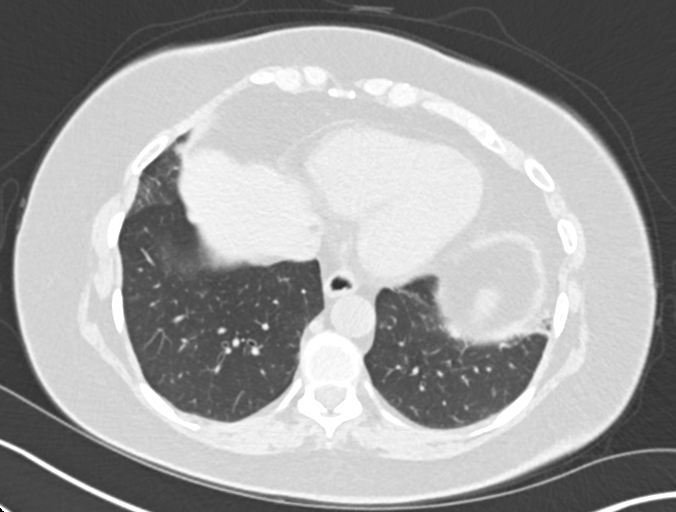
[im 68/148  lung]
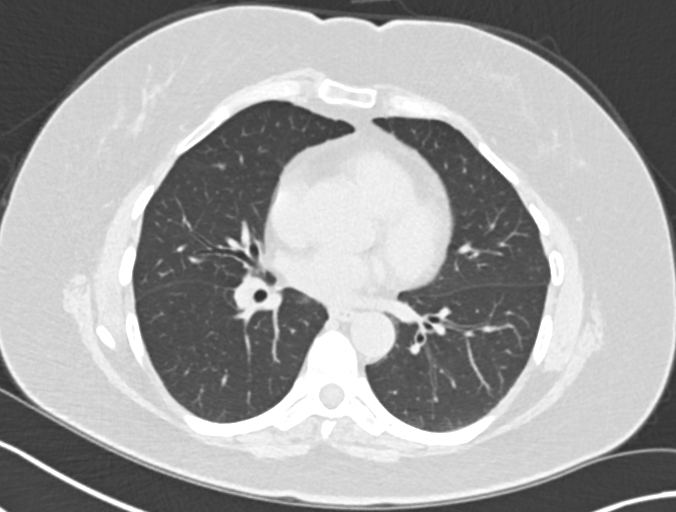
[im 80/148  mediastinal]
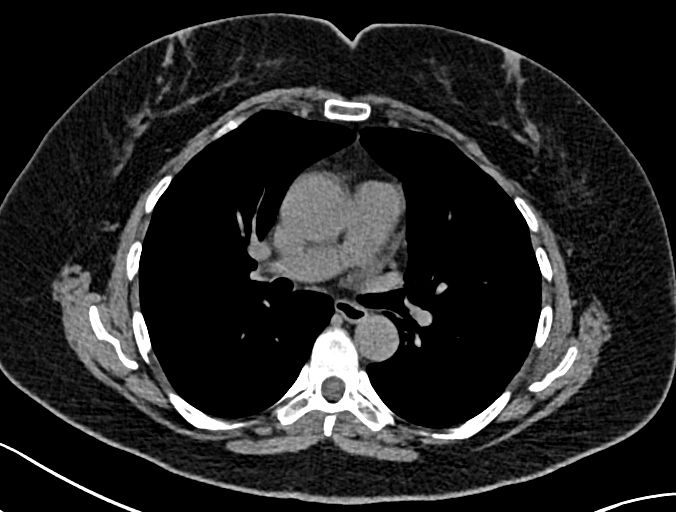
[im 80/148  lung]
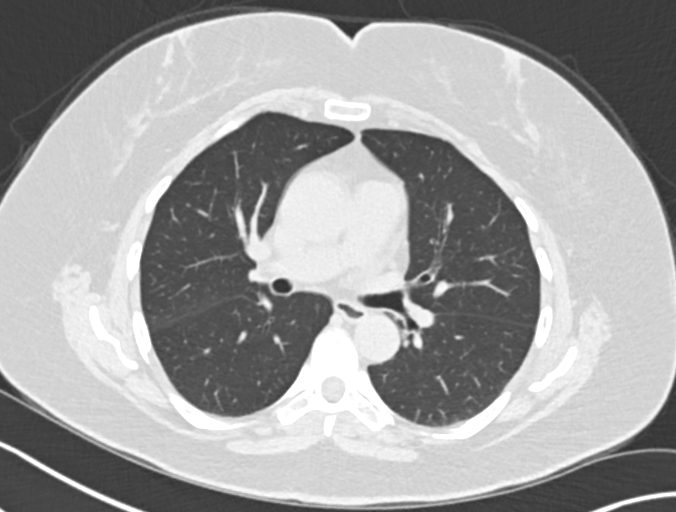
[im 102/148  lung]
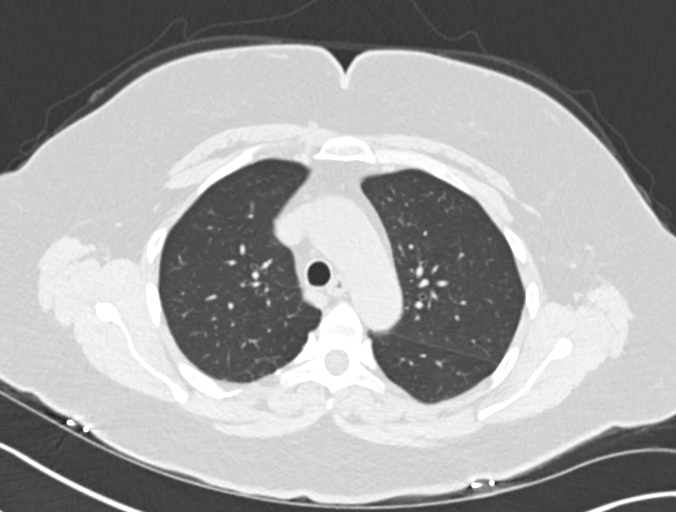
[im 114/148  lung]
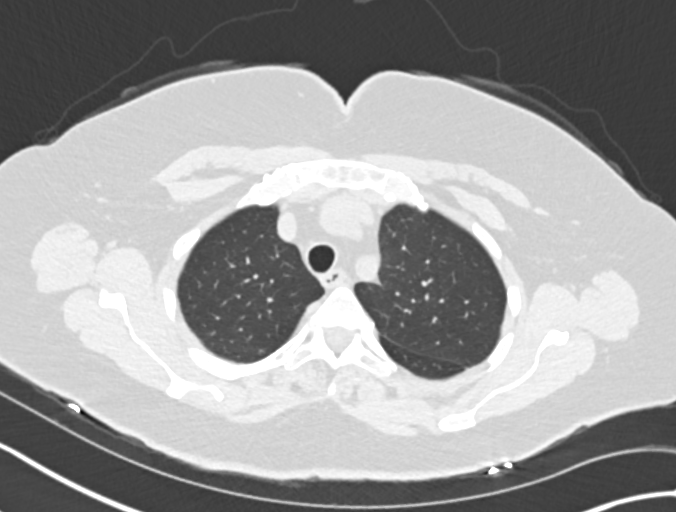
[im 136/148  lung]
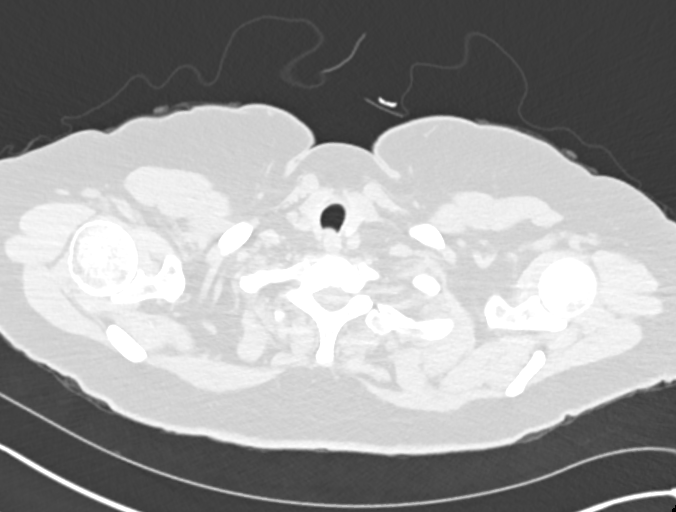

[Series 4: chest 2.00 br40 s3 · coronal · 0.58mm/px · 3 of 145 slices shown (2 of 2)]
[im 29/145  lung]
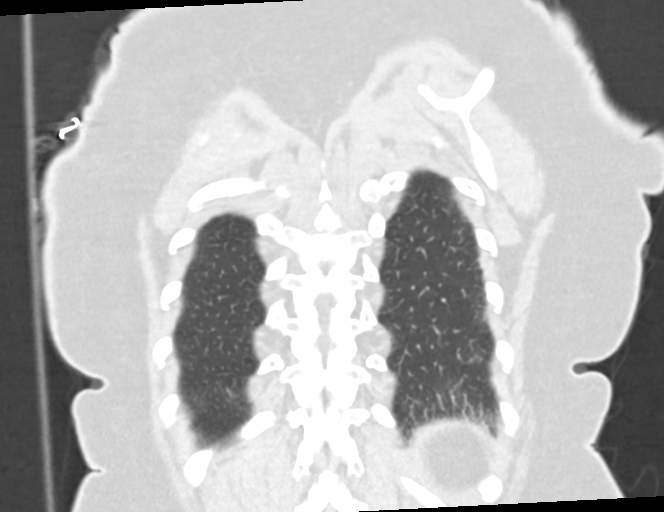
[im 58/145  lung]
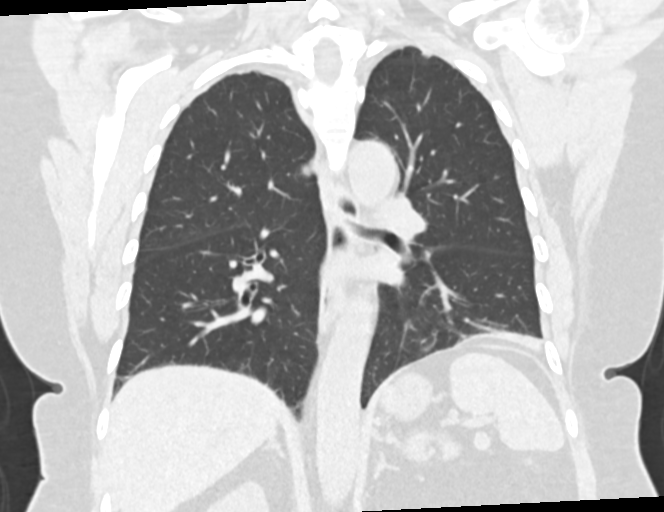
[im 87/145  lung]
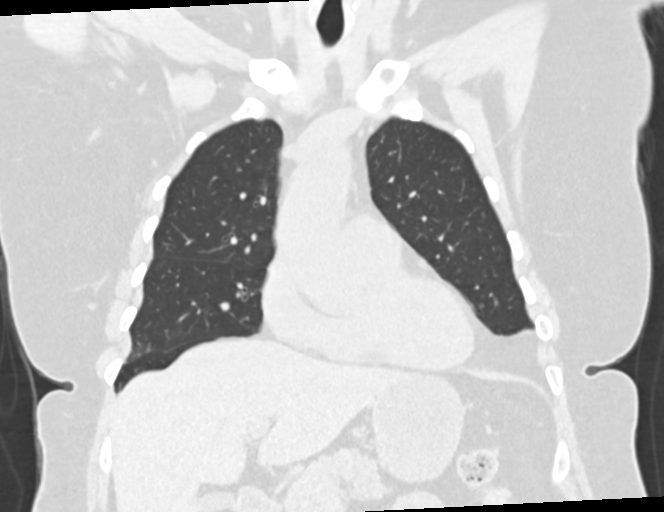

[11 of 36 positions shown; findings below may reference images not displayed]

FINDINGS: Cardiovascular: Upper normal ascending aorta at 3.9 cm, previously
3.7 cm. Heart is normal in size. No pericardial effusion. No
significant aortic atherosclerosis. Common origin of the
brachiocephalic and left common carotid artery, variant arch
anatomy.

Mediastinum/Nodes: No enlarged mediastinal lymph nodes. No
esophageal wall thickening. No evidence of thyroid nodule.

Lungs/Pleura: The previous left upper lobe pulmonary nodule is no
longer seen. Majority of the additional small nodules have also
resolved. Residual tiny pulmonary nodules including punctate right
upper lobe series 8, image 54 and right lower lobe series 8, image
104. No new or progressive pulmonary nodule. There is stable
inferior lingular scarring. Minimal anterior right middle lobe
scarring. No pleural fluid. No pleural calcifications. No findings
of pulmonary edema. Trachea and central bronchi are patent.

Upper Abdomen: No acute or unexpected findings.

Musculoskeletal: There are no acute or suspicious osseous
abnormalities.
IMPRESSION: 1. The previous left upper lobe pulmonary nodule is no longer seen.
Majority of the additional small nodules have also resolved. There
are in 2 tiny residual pulmonary nodules in the right upper and
lower lobes that are unchanged. These nodules are most certainly
benign. No dedicated imaging follow-up is recommended.
2. No new pulmonary nodule.

Aortic Atherosclerosis ([id number]).

## 2022-08-08 ENCOUNTER — Encounter (INDEPENDENT_AMBULATORY_CARE_PROVIDER_SITE_OTHER): Payer: Medicare HMO | Admitting: Ophthalmology

## 2022-08-08 DIAGNOSIS — H353221 Exudative age-related macular degeneration, left eye, with active choroidal neovascularization: Secondary | ICD-10-CM

## 2022-08-08 DIAGNOSIS — H353112 Nonexudative age-related macular degeneration, right eye, intermediate dry stage: Secondary | ICD-10-CM

## 2022-08-08 DIAGNOSIS — H43813 Vitreous degeneration, bilateral: Secondary | ICD-10-CM

## 2022-08-08 DIAGNOSIS — H33302 Unspecified retinal break, left eye: Secondary | ICD-10-CM | POA: Diagnosis not present

## 2022-08-23 DIAGNOSIS — G4733 Obstructive sleep apnea (adult) (pediatric): Secondary | ICD-10-CM | POA: Diagnosis not present

## 2022-08-27 DIAGNOSIS — G4733 Obstructive sleep apnea (adult) (pediatric): Secondary | ICD-10-CM | POA: Diagnosis not present

## 2022-09-05 ENCOUNTER — Encounter (INDEPENDENT_AMBULATORY_CARE_PROVIDER_SITE_OTHER): Payer: Medicare HMO | Admitting: Ophthalmology

## 2022-09-05 DIAGNOSIS — H353221 Exudative age-related macular degeneration, left eye, with active choroidal neovascularization: Secondary | ICD-10-CM | POA: Diagnosis not present

## 2022-09-05 DIAGNOSIS — H33302 Unspecified retinal break, left eye: Secondary | ICD-10-CM

## 2022-09-05 DIAGNOSIS — H43813 Vitreous degeneration, bilateral: Secondary | ICD-10-CM | POA: Diagnosis not present

## 2022-09-05 DIAGNOSIS — H353112 Nonexudative age-related macular degeneration, right eye, intermediate dry stage: Secondary | ICD-10-CM | POA: Diagnosis not present

## 2022-09-17 DIAGNOSIS — G4733 Obstructive sleep apnea (adult) (pediatric): Secondary | ICD-10-CM | POA: Diagnosis not present

## 2022-09-26 DIAGNOSIS — H25013 Cortical age-related cataract, bilateral: Secondary | ICD-10-CM | POA: Diagnosis not present

## 2022-09-26 DIAGNOSIS — H2513 Age-related nuclear cataract, bilateral: Secondary | ICD-10-CM | POA: Diagnosis not present

## 2022-09-27 DIAGNOSIS — G4733 Obstructive sleep apnea (adult) (pediatric): Secondary | ICD-10-CM | POA: Diagnosis not present

## 2022-10-03 ENCOUNTER — Encounter (INDEPENDENT_AMBULATORY_CARE_PROVIDER_SITE_OTHER): Payer: Medicare HMO | Admitting: Ophthalmology

## 2022-10-03 DIAGNOSIS — H33302 Unspecified retinal break, left eye: Secondary | ICD-10-CM

## 2022-10-03 DIAGNOSIS — H353112 Nonexudative age-related macular degeneration, right eye, intermediate dry stage: Secondary | ICD-10-CM | POA: Diagnosis not present

## 2022-10-03 DIAGNOSIS — H43813 Vitreous degeneration, bilateral: Secondary | ICD-10-CM

## 2022-10-03 DIAGNOSIS — H353221 Exudative age-related macular degeneration, left eye, with active choroidal neovascularization: Secondary | ICD-10-CM

## 2022-10-10 DIAGNOSIS — G4733 Obstructive sleep apnea (adult) (pediatric): Secondary | ICD-10-CM | POA: Diagnosis not present

## 2022-10-14 DIAGNOSIS — M170 Bilateral primary osteoarthritis of knee: Secondary | ICD-10-CM | POA: Diagnosis not present

## 2022-10-26 DIAGNOSIS — G4733 Obstructive sleep apnea (adult) (pediatric): Secondary | ICD-10-CM | POA: Diagnosis not present

## 2022-10-31 ENCOUNTER — Encounter (INDEPENDENT_AMBULATORY_CARE_PROVIDER_SITE_OTHER): Payer: Medicare HMO | Admitting: Ophthalmology

## 2022-10-31 DIAGNOSIS — H33302 Unspecified retinal break, left eye: Secondary | ICD-10-CM | POA: Diagnosis not present

## 2022-10-31 DIAGNOSIS — H353221 Exudative age-related macular degeneration, left eye, with active choroidal neovascularization: Secondary | ICD-10-CM | POA: Diagnosis not present

## 2022-10-31 DIAGNOSIS — H43813 Vitreous degeneration, bilateral: Secondary | ICD-10-CM

## 2022-10-31 DIAGNOSIS — H353112 Nonexudative age-related macular degeneration, right eye, intermediate dry stage: Secondary | ICD-10-CM | POA: Diagnosis not present

## 2022-11-12 DIAGNOSIS — Z961 Presence of intraocular lens: Secondary | ICD-10-CM | POA: Diagnosis not present

## 2022-11-12 DIAGNOSIS — H269 Unspecified cataract: Secondary | ICD-10-CM | POA: Diagnosis not present

## 2022-11-12 DIAGNOSIS — H2512 Age-related nuclear cataract, left eye: Secondary | ICD-10-CM | POA: Diagnosis not present

## 2022-11-12 DIAGNOSIS — H25012 Cortical age-related cataract, left eye: Secondary | ICD-10-CM | POA: Diagnosis not present

## 2022-11-26 DIAGNOSIS — H2511 Age-related nuclear cataract, right eye: Secondary | ICD-10-CM | POA: Diagnosis not present

## 2022-11-26 DIAGNOSIS — H25011 Cortical age-related cataract, right eye: Secondary | ICD-10-CM | POA: Diagnosis not present

## 2022-11-26 DIAGNOSIS — H269 Unspecified cataract: Secondary | ICD-10-CM | POA: Diagnosis not present

## 2022-11-26 DIAGNOSIS — H25811 Combined forms of age-related cataract, right eye: Secondary | ICD-10-CM | POA: Diagnosis not present

## 2022-11-26 DIAGNOSIS — Z961 Presence of intraocular lens: Secondary | ICD-10-CM | POA: Diagnosis not present

## 2022-11-26 DIAGNOSIS — G4733 Obstructive sleep apnea (adult) (pediatric): Secondary | ICD-10-CM | POA: Diagnosis not present

## 2022-11-28 ENCOUNTER — Encounter (INDEPENDENT_AMBULATORY_CARE_PROVIDER_SITE_OTHER): Payer: Medicare HMO | Admitting: Ophthalmology

## 2022-11-28 DIAGNOSIS — H353112 Nonexudative age-related macular degeneration, right eye, intermediate dry stage: Secondary | ICD-10-CM | POA: Diagnosis not present

## 2022-11-28 DIAGNOSIS — H33302 Unspecified retinal break, left eye: Secondary | ICD-10-CM | POA: Diagnosis not present

## 2022-11-28 DIAGNOSIS — H353221 Exudative age-related macular degeneration, left eye, with active choroidal neovascularization: Secondary | ICD-10-CM | POA: Diagnosis not present

## 2022-11-28 DIAGNOSIS — H43813 Vitreous degeneration, bilateral: Secondary | ICD-10-CM

## 2022-12-23 DIAGNOSIS — M170 Bilateral primary osteoarthritis of knee: Secondary | ICD-10-CM | POA: Diagnosis not present

## 2022-12-26 ENCOUNTER — Encounter (INDEPENDENT_AMBULATORY_CARE_PROVIDER_SITE_OTHER): Payer: Medicare HMO | Admitting: Ophthalmology

## 2022-12-26 DIAGNOSIS — H43813 Vitreous degeneration, bilateral: Secondary | ICD-10-CM

## 2022-12-26 DIAGNOSIS — G4733 Obstructive sleep apnea (adult) (pediatric): Secondary | ICD-10-CM | POA: Diagnosis not present

## 2022-12-26 DIAGNOSIS — H353112 Nonexudative age-related macular degeneration, right eye, intermediate dry stage: Secondary | ICD-10-CM

## 2022-12-26 DIAGNOSIS — H33302 Unspecified retinal break, left eye: Secondary | ICD-10-CM

## 2022-12-26 DIAGNOSIS — H353221 Exudative age-related macular degeneration, left eye, with active choroidal neovascularization: Secondary | ICD-10-CM | POA: Diagnosis not present

## 2023-01-16 DIAGNOSIS — G4733 Obstructive sleep apnea (adult) (pediatric): Secondary | ICD-10-CM | POA: Diagnosis not present

## 2023-01-22 ENCOUNTER — Ambulatory Visit: Payer: Self-pay | Admitting: Student

## 2023-01-23 ENCOUNTER — Encounter (INDEPENDENT_AMBULATORY_CARE_PROVIDER_SITE_OTHER): Payer: Medicare HMO | Admitting: Ophthalmology

## 2023-01-23 DIAGNOSIS — H353221 Exudative age-related macular degeneration, left eye, with active choroidal neovascularization: Secondary | ICD-10-CM | POA: Diagnosis not present

## 2023-01-23 DIAGNOSIS — H43813 Vitreous degeneration, bilateral: Secondary | ICD-10-CM | POA: Diagnosis not present

## 2023-01-23 DIAGNOSIS — H33302 Unspecified retinal break, left eye: Secondary | ICD-10-CM

## 2023-01-23 DIAGNOSIS — H353112 Nonexudative age-related macular degeneration, right eye, intermediate dry stage: Secondary | ICD-10-CM | POA: Diagnosis not present

## 2023-01-26 DIAGNOSIS — G4733 Obstructive sleep apnea (adult) (pediatric): Secondary | ICD-10-CM | POA: Diagnosis not present

## 2023-01-27 ENCOUNTER — Encounter (INDEPENDENT_AMBULATORY_CARE_PROVIDER_SITE_OTHER): Payer: Medicare HMO | Admitting: Ophthalmology

## 2023-02-04 NOTE — Patient Instructions (Signed)
SURGICAL WAITING ROOM VISITATION  Patients having surgery or a procedure may have no more than 2 support people in the waiting area - these visitors may rotate.    Children under the age of 18 must have an adult with them who is not the patient.   If the patient needs to stay at the hospital during part of their recovery, the visitor guidelines for inpatient rooms apply. Pre-op nurse will coordinate an appropriate time for 1 support person to accompany patient in pre-op.  This support person may not rotate.    Please refer to the [facility] website for the visitor guidelines for Inpatients (after your surgery is over and you are in a regular room).       Your procedure is scheduled on: 02-20-23   Report to [facility] Main Entrance    Report to admitting at       [phone]   Call this number if you have problems the morning of surgery [phone]   Do not eat food :After Midnight.   After Midnight you may have the following liquids until __0430____ AM/ DAY OF SURGERY  Water Non-Citrus Juices (without pulp, NO RED-Apple, White grape, White cranberry) Black Coffee (NO MILK/CREAM OR CREAMERS, sugar ok)  Clear Tea (NO MILK/CREAM OR CREAMERS, sugar ok) regular and decaf                             Plain Jell-O (NO RED)                                           Fruit ices (not with fruit pulp, NO RED)                                     Popsicles (NO RED)                                                               Sports drinks like Gatorade (NO RED)                    The day of surgery:  Drink ONE (1) Pre-Surgery Clear Ensure or G2 at    0420   AM the morning of surgery. Drink in one sitting. Do not sip.  This drink was given to you during your hospital  pre-op appointment visit. Nothing else to drink after completing the  Pre-Surgery Clear Ensure or G2.  COMPLETE BY 0430 AM           If you have questions, please contact your surgeon's office.   FOLLOW  ANY  ADDITIONAL PRE OP INSTRUCTIONS YOU RECEIVED FROM YOUR SURGEON'S OFFICE!!!     Oral Hygiene is also important to reduce your risk of infection.                                    Remember - BRUSH YOUR TEETH THE MORNING OF SURGERY WITH YOUR REGULAR TOOTHPASTE  DENTURES WILL BE  REMOVED PRIOR TO SURGERY PLEASE DO NOT APPLY "Poly grip" OR ADHESIVES!!!   Do NOT smoke after Midnight   Take these medicines the morning of surgery with A SIP OF WATER: ROSUVASTATIN, SERTRALINE    Bring CPAP mask and tubing day of surgery.                              You may not have any metal on your body including hair pins, jewelry, and body piercing             Do not wear make-up, lotions, powders, perfumes/cologne, or deodorant  Do not wear nail polish including gel and S&S, artificial/acrylic nails, or any other type of covering on natural nails including finger and toenails. If you have artificial nails, gel coating, etc. that needs to be removed by a nail salon please have this removed prior to surgery or surgery may need to be canceled/ delayed if the surgeon/ anesthesia feels like they are unable to be safely monitored.   Do not shave  48 hours prior to surgery.                Do not bring valuables to the hospital. [facility] IS NOT             RESPONSIBLE   FOR VALUABLES.   Contacts, glasses, dentures or bridgework may not be worn into surgery.      DO NOT BRING YOUR HOME MEDICATIONS TO THE HOSPITAL. PHARMACY WILL DISPENSE MEDICATIONS LISTED ON YOUR MEDICATION LIST TO YOU DURING YOUR ADMISSION IN THE HOSPITAL!    Patients discharged on the day of surgery will not be allowed to drive home.  Someone NEEDS to stay with you for the first 24 hours after anesthesia.                Please read over the following fact sheets you were given: IF YOU HAVE QUESTIONS ABOUT YOUR PRE-OP INSTRUCTIONS PLEASE CALL [phone]   I If you test positive for Covid or have been in contact with anyone that has  tested positive in the last 10 days please notify you surgeon.   Pre-operative 5 CHG Bath Instructions   You can play a key role in reducing the risk of infection after surgery. Your skin needs to be as free of germs as possible. You can reduce the number of germs on your skin by washing with CHG (chlorhexidine gluconate) soap before surgery. CHG is an antiseptic soap that kills germs and continues to kill germs even after washing.   DO NOT use if you have an allergy to chlorhexidine/CHG or antibacterial soaps. If your skin becomes reddened or irritated, stop using the CHG and notify one of our RNs at [phone].   Please shower with the CHG soap starting 4 days before surgery using the following schedule:     Please keep in mind the following:  DO NOT shave, including legs and underarms, starting the day of your first shower.   You may shave your face at any point before/day of surgery.  Place clean sheets on your bed the day you start using CHG soap. Use a clean washcloth (not used since being washed) for each shower. DO NOT sleep with pets once you start using the CHG.   CHG Shower Instructions:  If you choose to wash your hair and private area, wash first with your normal shampoo/soap.  After you use shampoo/soap, rinse  your hair and body thoroughly to remove shampoo/soap residue.  Turn the water OFF and apply about 3 tablespoons (45 ml) of CHG soap to a CLEAN washcloth.  Apply CHG soap ONLY FROM YOUR NECK DOWN TO YOUR TOES (washing for 3-5 minutes)  DO NOT use CHG soap on face, private areas, open wounds, or sores.  Pay special attention to the area where your surgery is being performed.  If you are having back surgery, having someone wash your back for you may be helpful. Wait 2 minutes after CHG soap is applied, then you may rinse off the CHG soap.  Pat dry with a clean towel  Put on clean clothes/pajamas   If you choose to wear lotion, please use ONLY the CHG-compatible  lotions on the back of this paper.     Additional instructions for the day of surgery: DO NOT APPLY any lotions, deodorants, cologne, or perfumes.   Put on clean/comfortable clothes.  Brush your teeth.  Ask your nurse before applying any prescription medications to the skin.      CHG Compatible Lotions   Aveeno Moisturizing lotion  Cetaphil Moisturizing Cream  Cetaphil Moisturizing Lotion  Clairol Herbal Essence Moisturizing Lotion, Dry Skin  Clairol Herbal Essence Moisturizing Lotion, Extra Dry Skin  Clairol Herbal Essence Moisturizing Lotion, Normal Skin  Curel Age Defying Therapeutic Moisturizing Lotion with Alpha Hydroxy  Curel Extreme Care Body Lotion  Curel Soothing Hands Moisturizing Hand Lotion  Curel Therapeutic Moisturizing Cream, Fragrance-Free  Curel Therapeutic Moisturizing Lotion, Fragrance-Free  Curel Therapeutic Moisturizing Lotion, Original Formula  Eucerin Daily Replenishing Lotion  Eucerin Dry Skin Therapy Plus Alpha Hydroxy Crme  Eucerin Dry Skin Therapy Plus Alpha Hydroxy Lotion  Eucerin Original Crme  Eucerin Original Lotion  Eucerin Plus Crme Eucerin Plus Lotion  Eucerin TriLipid Replenishing Lotion  Keri Anti-Bacterial Hand Lotion  Keri Deep Conditioning Original Lotion Dry Skin Formula Softly Scented  Keri Deep Conditioning Original Lotion, Fragrance Free Sensitive Skin Formula  Keri Lotion Fast Absorbing Fragrance Free Sensitive Skin Formula  Keri Lotion Fast Absorbing Softly Scented Dry Skin Formula  Keri Original Lotion  Keri Skin Renewal Lotion Keri Silky Smooth Lotion  Keri Silky Smooth Sensitive Skin Lotion  Nivea Body Creamy Conditioning Oil  Nivea Body Extra Enriched Lotion  Nivea Body Original Lotion  Nivea Body Sheer Moisturizing Lotion Nivea Crme  Nivea Skin Firming Lotion  NutraDerm 30 Skin Lotion  NutraDerm Skin Lotion  NutraDerm Therapeutic Skin Cream  NutraDerm Therapeutic Skin Lotion  ProShield Protective Hand Cream   Provon moisturizing lotion     Incentive Spirometer  An incentive spirometer is a tool that can help keep your lungs clear and active. This tool measures how well you are filling your lungs with each breath. Taking long deep breaths may help reverse or decrease the chance of developing breathing (pulmonary) problems (especially infection) following: A long period of time when you are unable to move or be active. BEFORE THE PROCEDURE  If the spirometer includes an indicator to show your best effort, your nurse or respiratory therapist will set it to a desired goal. If possible, sit up straight or lean slightly forward. Try not to slouch. Hold the incentive spirometer in an upright position. INSTRUCTIONS FOR USE  Sit on the edge of your bed if possible, or sit up as far as you can in bed or on a chair. Hold the incentive spirometer in an upright position. Breathe out normally. Place the mouthpiece in your mouth and seal your lips  tightly around it. Breathe in slowly and as deeply as possible, raising the piston or the ball toward the top of the column. Hold your breath for 3-5 seconds or for as long as possible. Allow the piston or ball to fall to the bottom of the column. Remove the mouthpiece from your mouth and breathe out normally. Rest for a few seconds and repeat Steps 1 through 7 at least 10 times every 1-2 hours when you are awake. Take your time and take a few normal breaths between deep breaths. The spirometer may include an indicator to show your best effort. Use the indicator as a goal to work toward during each repetition. After each set of 10 deep breaths, practice coughing to be sure your lungs are clear. If you have an incision (the cut made at the time of surgery), support your incision when coughing by placing a pillow or rolled up towels firmly against it. Once you are able to get out of bed, walk around indoors and cough well. You may stop using the incentive spirometer when  instructed by your caregiver.  RISKS AND COMPLICATIONS Take your time so you do not get dizzy or light-headed. If you are in pain, you may need to take or ask for pain medication before doing incentive spirometry. It is harder to take a deep breath if you are having pain. AFTER USE Rest and breathe slowly and easily. It can be helpful to keep track of a log of your progress. Your caregiver can provide you with a simple table to help with this. If you are using the spirometer at home, follow these instructions: SEEK MEDICAL CARE IF:  You are having difficultly using the spirometer. You have trouble using the spirometer as often as instructed. Your pain medication is not giving enough relief while using the spirometer. You develop fever of 100.5 F (38.1 C) or higher. SEEK IMMEDIATE MEDICAL CARE IF:  You cough up bloody sputum that had not been present before. You develop fever of 102 F (38.9 C) or greater. You develop worsening pain at or near the incision site. MAKE SURE YOU:  Understand these instructions. Will watch your condition. Will get help right away if you are not doing well or get worse. Document Released: 12/02/2006 Document Revised: 10/14/2011 Document Reviewed: 02/02/2007 [facility] Patient Information 2014 [place], [place].   ________________________________________________________________________

## 2023-02-04 NOTE — Progress Notes (Addendum)
PCP -  Dr. Ardean Larsen Clearance on chart from 12-24-22 Cardiologist - no  PPM/ICD -  Device Orders -  Rep Notified -   Chest x-ray -  EKG -  Stress Test -  ECHO -  Cardiac Cath -  CT cardiac scoring 04-22-22  Sleep Study -  CPAP - YES  Fasting Blood Sugar -  Checks Blood Sugar _____ times a day  Blood Thinner Instructions: Aspirin Instructions:  ERAS Protcol - PRE-SURGERY Ensure    COVID vaccine -yes  Activity-- Able to climb a flight of stairs without SOB or CP Anesthesia review: OSA  Patient denies shortness of breath, fever, cough and chest pain at PAT appointment   All instructions explained to the patient, with a verbal understanding of the material. Patient agrees to go over the instructions while at home for a better understanding. Patient also instructed to self quarantine after being tested for COVID-19. The opportunity to ask questions was provided.

## 2023-02-07 ENCOUNTER — Encounter (HOSPITAL_COMMUNITY): Payer: Self-pay

## 2023-02-07 ENCOUNTER — Other Ambulatory Visit: Payer: Self-pay

## 2023-02-07 ENCOUNTER — Encounter (HOSPITAL_COMMUNITY)
Admission: RE | Admit: 2023-02-07 | Discharge: 2023-02-07 | Disposition: A | Discharge status: Home / Self Care | Payer: Medicare HMO | Source: Ambulatory Visit | Attending: Orthopedic Surgery | Admitting: Orthopedic Surgery

## 2023-02-07 VITALS — BP 101/80 | HR 80 | Temp 97.9°F | Resp 16 | Ht 65.0 in | Wt 209.0 lb

## 2023-02-07 DIAGNOSIS — Z01812 Encounter for preprocedural laboratory examination: Secondary | ICD-10-CM | POA: Diagnosis not present

## 2023-02-07 DIAGNOSIS — Z01818 Encounter for other preprocedural examination: Secondary | ICD-10-CM

## 2023-02-07 HISTORY — DX: Anxiety disorder, unspecified: F41.9

## 2023-02-07 HISTORY — DX: Sleep apnea, unspecified: G47.30

## 2023-02-07 HISTORY — DX: Gastro-esophageal reflux disease without esophagitis: K21.9

## 2023-02-07 HISTORY — DX: Headache, unspecified: R51.9

## 2023-02-07 HISTORY — DX: Unspecified osteoarthritis, unspecified site: M19.90

## 2023-02-07 LAB — CBC
HCT: 41.5 % (ref 36.0–46.0)
Hemoglobin: 13.6 g/dL (ref 12.0–15.0)
MCH: 28.9 pg (ref 26.0–34.0)
MCHC: 32.8 g/dL (ref 30.0–36.0)
MCV: 88.1 fL (ref 80.0–100.0)
Platelets: 215 10*3/uL (ref 150–400)
RBC: 4.71 MIL/uL (ref 3.87–5.11)
RDW: 13 % (ref 11.5–15.5)
WBC: 5.6 10*3/uL (ref 4.0–10.5)
nRBC: 0 % (ref 0.0–0.2)

## 2023-02-07 LAB — BASIC METABOLIC PANEL
Anion gap: 9 (ref 5–15)
BUN: 18 mg/dL (ref 8–23)
CO2: 25 mmol/L (ref 22–32)
Calcium: 8.9 mg/dL (ref 8.9–10.3)
Chloride: 104 mmol/L (ref 98–111)
Creatinine, Ser: 0.86 mg/dL (ref 0.44–1.00)
GFR, Estimated: >60 mL/min (ref >60)
Glucose, Bld: 99 mg/dL (ref 70–99)
Potassium: 4.4 mmol/L (ref 3.5–5.1)
Sodium: 138 mmol/L (ref 135–145)

## 2023-02-07 LAB — SURGICAL PCR SCREEN
MRSA, PCR: NEGATIVE
Staphylococcus aureus: NEGATIVE

## 2023-02-11 DIAGNOSIS — M1712 Unilateral primary osteoarthritis, left knee: Secondary | ICD-10-CM | POA: Diagnosis not present

## 2023-02-11 DIAGNOSIS — M25562 Pain in left knee: Secondary | ICD-10-CM | POA: Diagnosis not present

## 2023-02-14 DIAGNOSIS — K219 Gastro-esophageal reflux disease without esophagitis: Secondary | ICD-10-CM | POA: Diagnosis not present

## 2023-02-14 DIAGNOSIS — G43109 Migraine with aura, not intractable, without status migrainosus: Secondary | ICD-10-CM | POA: Diagnosis not present

## 2023-02-14 DIAGNOSIS — Z0000 Encounter for general adult medical examination without abnormal findings: Secondary | ICD-10-CM | POA: Diagnosis not present

## 2023-02-14 DIAGNOSIS — M1710 Unilateral primary osteoarthritis, unspecified knee: Secondary | ICD-10-CM | POA: Diagnosis not present

## 2023-02-14 DIAGNOSIS — E7800 Pure hypercholesterolemia, unspecified: Secondary | ICD-10-CM | POA: Diagnosis not present

## 2023-02-14 DIAGNOSIS — F39 Unspecified mood [affective] disorder: Secondary | ICD-10-CM | POA: Diagnosis not present

## 2023-02-14 DIAGNOSIS — Z77090 Contact with and (suspected) exposure to asbestos: Secondary | ICD-10-CM | POA: Diagnosis not present

## 2023-02-14 DIAGNOSIS — I700 Atherosclerosis of aorta: Secondary | ICD-10-CM | POA: Diagnosis not present

## 2023-02-14 DIAGNOSIS — R911 Solitary pulmonary nodule: Secondary | ICD-10-CM | POA: Diagnosis not present

## 2023-02-14 DIAGNOSIS — G4733 Obstructive sleep apnea (adult) (pediatric): Secondary | ICD-10-CM | POA: Diagnosis not present

## 2023-02-18 ENCOUNTER — Ambulatory Visit: Payer: Self-pay | Admitting: Student

## 2023-02-18 NOTE — H&P (Signed)
TOTAL KNEE ADMISSION H&P  Patient is being admitted for left total knee arthroplasty.  Subjective:  Chief Complaint:left knee pain.  HPI: Cheyenne Miller, 67 y.o. female, has a history of pain and functional disability in the left knee due to arthritis and has failed non-surgical conservative treatments for greater than 12 weeks to includeNSAID's and/or analgesics, corticosteriod injections, flexibility and strengthening excercises, use of assistive devices, and activity modification.  Onset of symptoms was gradual, starting 10 years ago with rapidlly worsening course since that time. The patient noted no past surgery on the left knee(s).  Patient currently rates pain in the left knee(s) at 10 out of 10 with activity. Patient has night pain, worsening of pain with activity and weight bearing, pain that interferes with activities of daily living, pain with passive range of motion, crepitus, and joint swelling.  Patient has evidence of subchondral cysts, subchondral sclerosis, periarticular osteophytes, and joint space narrowing by imaging studies. There is no active infection.  Patient Active Problem List   Diagnosis Date Noted   Sinusitis, chronic 07/03/2016   Chronic cough 05/08/2016   Past Medical History:  Diagnosis Date   Anxiety    Arthritis    GERD (gastroesophageal reflux disease)    Headache    migraines   Sleep apnea    wears Cpap    Past Surgical History:  Procedure Laterality Date   cyst removed from wrist Left    ganglion   EYE SURGERY     cataract bil   SHOULDER ARTHROSCOPY      Current Outpatient Medications  Medication Sig Dispense Refill Last Dose   benzonatate (TESSALON) 200 MG capsule Take 1 capsule (200 mg total) by mouth 3 (three) times daily as needed for cough. (Patient not taking: Reported on 02/03/2023) 30 capsule 0    Calcium Carbonate-Vitamin D (CALTRATE 600+D PO) Take 1 tablet by mouth daily.      Multiple Vitamin (MULTIVITAMIN WITH MINERALS) TABS  tablet Take 1 tablet by mouth daily.      Multiple Vitamins-Minerals (ICAPS AREDS 2 PO) Take 1 capsule by mouth in the morning and at bedtime.      Peppermint Oil (IBGARD PO) Take 1 tablet by mouth daily as needed (GI Discomfort).      rosuvastatin (CRESTOR) 10 MG tablet Take 5 mg by mouth daily.      sertraline (ZOLOFT) 100 MG tablet Take 150 mg by mouth daily.      SUMAtriptan (IMITREX) 100 MG tablet Take 100 mg by mouth every 2 (two) hours as needed for migraine. May repeat in 2 hours if headache persists or recurs.      No current facility-administered medications for this visit.   Allergies  Allergen Reactions   Meloxicam     Caused Stomach Ulcers   Latex Rash   Sulfa Antibiotics Rash    Social History   Tobacco Use   Smoking status: Never   Smokeless tobacco: Never  Substance Use Topics   Alcohol use: Yes    Alcohol/week: 2.0 standard drinks of alcohol    Types: 2 Glasses of wine per week    Comment: occasional    Family History  Problem Relation Age of Onset   Breast cancer Neg Hx      Review of Systems  Musculoskeletal:  Positive for arthralgias, gait problem and joint swelling.  All other systems reviewed and are negative.   Objective:  Physical Exam Constitutional:      Appearance: Normal appearance.  HENT:  Head: Normocephalic and atraumatic.     Nose: Nose normal.     Mouth/Throat:     Mouth: Mucous membranes are moist.     Pharynx: Oropharynx is clear.  Eyes:     Conjunctiva/sclera: Conjunctivae normal.  Cardiovascular:     Rate and Rhythm: Normal rate and regular rhythm.     Pulses: Normal pulses.     Heart sounds: Normal heart sounds.  Pulmonary:     Effort: Pulmonary effort is normal.     Breath sounds: Normal breath sounds.  Abdominal:     General: Abdomen is flat.     Palpations: Abdomen is soft.  Genitourinary:    Comments: deferred Musculoskeletal:     Cervical back: Normal range of motion and neck supple.     Comments:  Examination the left knee reveals no skin wounds or lesions. She has some swelling. No erythema or effusion. No angular deformity. Tenderness to palpation medial joint line and peripatellar retinacular tissues with positive grind sign. Range of motion 0 to 120 degrees without any instability. Painless range of motion of the hip.  Distally, there is no focal motor or sensory deficit. Palpable pedal pulse.  Skin:    General: Skin is warm and dry.     Capillary Refill: Capillary refill takes less than 2 seconds.  Neurological:     General: No focal deficit present.     Mental Status: She is alert and oriented to person, place, and time.  Psychiatric:        Mood and Affect: Mood normal.        Behavior: Behavior normal.        Thought Content: Thought content normal.        Judgment: Judgment normal.     Vital signs in last 24 hours: @VSRANGES @  Labs:   Estimated body mass index is 34.78 kg/m as calculated from the following:   Height as of 02/07/23: 5\' 5"  (1.651 m).   Weight as of 02/07/23: 94.8 kg.   Imaging Review Plain radiographs demonstrate severe degenerative joint disease of the left knee(s). The overall alignment isneutral. The bone quality appears to be adequate for age and reported activity level.      Assessment/Plan:  End stage arthritis, left knee   The patient history, physical examination, clinical judgment of the provider and imaging studies are consistent with end stage degenerative joint disease of the left knee(s) and total knee arthroplasty is deemed medically necessary. The treatment options including medical management, injection therapy arthroscopy and arthroplasty were discussed at length. The risks and benefits of total knee arthroplasty were presented and reviewed. The risks due to aseptic loosening, infection, stiffness, patella tracking problems, thromboembolic complications and other imponderables were discussed. The patient acknowledged the explanation,  agreed to proceed with the plan and consent was signed. Patient is being admitted for inpatient treatment for surgery, pain control, PT, OT, prophylactic antibiotics, VTE prophylaxis, progressive ambulation and ADL's and discharge planning. The patient is planning to be discharged home with OPPT.   Therapy Plans: outpatient therapy At [facility] 02/24/23.  Disposition: Home with sister and friend Planned DVT Prophylaxis: aspirin 81mg  BID DME needed: walker. Ice machine today.  PCP: Cleared.  TXA: IV Allergies:  - Sulfa antibiotics - rash.  Anesthesia Concerns: None.  BMI: 36 Last HgbA1c: Not diabetic.  Other: - OSA, use CPAP - History of stomach ulcers.  - Avastin (bevacizumab) eye injections, okay to proceed with.  - Last left knee cortisone injection  10/14/22.  - Oxycodone, zofran.  [facility].  - NO NSAIDs - 02/07/23: Hgb 13.6, Cr. 0.86, K+ 4.4.    Patient's anticipated LOS is less than 2 midnights, meeting these requirements: - Younger than 31 - Lives within 1 hour of care - Has a competent adult at home to recover with post-op recover - NO history of  - Chronic pain requiring opiods  - Diabetes  - Coronary Artery Disease  - Heart failure  - Heart attack  - Stroke  - DVT/VTE  - Cardiac arrhythmia  - Respiratory Failure/COPD  - Renal failure  - Anemia  - Advanced Liver disease

## 2023-02-18 NOTE — H&P (View-Only) (Signed)
TOTAL KNEE ADMISSION H&P  Patient is being admitted for left total knee arthroplasty.  Subjective:  Chief Complaint:left knee pain.  HPI: Cheyenne Miller, 67 y.o. female, has a history of pain and functional disability in the left knee due to arthritis and has failed non-surgical conservative treatments for greater than 12 weeks to includeNSAID's and/or analgesics, corticosteriod injections, flexibility and strengthening excercises, use of assistive devices, and activity modification.  Onset of symptoms was gradual, starting 10 years ago with rapidlly worsening course since that time. The patient noted no past surgery on the left knee(s).  Patient currently rates pain in the left knee(s) at 10 out of 10 with activity. Patient has night pain, worsening of pain with activity and weight bearing, pain that interferes with activities of daily living, pain with passive range of motion, crepitus, and joint swelling.  Patient has evidence of subchondral cysts, subchondral sclerosis, periarticular osteophytes, and joint space narrowing by imaging studies. There is no active infection.  Patient Active Problem List   Diagnosis Date Noted   Sinusitis, chronic 07/03/2016   Chronic cough 05/08/2016   Past Medical History:  Diagnosis Date   Anxiety    Arthritis    GERD (gastroesophageal reflux disease)    Headache    migraines   Sleep apnea    wears Cpap    Past Surgical History:  Procedure Laterality Date   cyst removed from wrist Left    ganglion   EYE SURGERY     cataract bil   SHOULDER ARTHROSCOPY      Current Outpatient Medications  Medication Sig Dispense Refill Last Dose   benzonatate (TESSALON) 200 MG capsule Take 1 capsule (200 mg total) by mouth 3 (three) times daily as needed for cough. (Patient not taking: Reported on 02/03/2023) 30 capsule 0    Calcium Carbonate-Vitamin D (CALTRATE 600+D PO) Take 1 tablet by mouth daily.      Multiple Vitamin (MULTIVITAMIN WITH MINERALS) TABS  tablet Take 1 tablet by mouth daily.      Multiple Vitamins-Minerals (ICAPS AREDS 2 PO) Take 1 capsule by mouth in the morning and at bedtime.      Peppermint Oil (IBGARD PO) Take 1 tablet by mouth daily as needed (GI Discomfort).      rosuvastatin (CRESTOR) 10 MG tablet Take 5 mg by mouth daily.      sertraline (ZOLOFT) 100 MG tablet Take 150 mg by mouth daily.      SUMAtriptan (IMITREX) 100 MG tablet Take 100 mg by mouth every 2 (two) hours as needed for migraine. May repeat in 2 hours if headache persists or recurs.      No current facility-administered medications for this visit.   Allergies  Allergen Reactions   Meloxicam     Caused Stomach Ulcers   Latex Rash   Sulfa Antibiotics Rash    Social History   Tobacco Use   Smoking status: Never   Smokeless tobacco: Never  Substance Use Topics   Alcohol use: Yes    Alcohol/week: 2.0 standard drinks of alcohol    Types: 2 Glasses of wine per week    Comment: occasional    Family History  Problem Relation Age of Onset   Breast cancer Neg Hx      Review of Systems  Musculoskeletal:  Positive for arthralgias, gait problem and joint swelling.  All other systems reviewed and are negative.   Objective:  Physical Exam Constitutional:      Appearance: Normal appearance.  HENT:  Head: Normocephalic and atraumatic.     Nose: Nose normal.     Mouth/Throat:     Mouth: Mucous membranes are moist.     Pharynx: Oropharynx is clear.  Eyes:     Conjunctiva/sclera: Conjunctivae normal.  Cardiovascular:     Rate and Rhythm: Normal rate and regular rhythm.     Pulses: Normal pulses.     Heart sounds: Normal heart sounds.  Pulmonary:     Effort: Pulmonary effort is normal.     Breath sounds: Normal breath sounds.  Abdominal:     General: Abdomen is flat.     Palpations: Abdomen is soft.  Genitourinary:    Comments: deferred Musculoskeletal:     Cervical back: Normal range of motion and neck supple.     Comments:  Examination the left knee reveals no skin wounds or lesions. She has some swelling. No erythema or effusion. No angular deformity. Tenderness to palpation medial joint line and peripatellar retinacular tissues with positive grind sign. Range of motion 0 to 120 degrees without any instability. Painless range of motion of the hip.  Distally, there is no focal motor or sensory deficit. Palpable pedal pulse.  Skin:    General: Skin is warm and dry.     Capillary Refill: Capillary refill takes less than 2 seconds.  Neurological:     General: No focal deficit present.     Mental Status: She is alert and oriented to person, place, and time.  Psychiatric:        Mood and Affect: Mood normal.        Behavior: Behavior normal.        Thought Content: Thought content normal.        Judgment: Judgment normal.     Vital signs in last 24 hours: @VSRANGES @  Labs:   Estimated body mass index is 34.78 kg/m as calculated from the following:   Height as of 02/07/23: 5\' 5"  (1.651 m).   Weight as of 02/07/23: 94.8 kg.   Imaging Review Plain radiographs demonstrate severe degenerative joint disease of the left knee(s). The overall alignment isneutral. The bone quality appears to be adequate for age and reported activity level.      Assessment/Plan:  End stage arthritis, left knee   The patient history, physical examination, clinical judgment of the provider and imaging studies are consistent with end stage degenerative joint disease of the left knee(s) and total knee arthroplasty is deemed medically necessary. The treatment options including medical management, injection therapy arthroscopy and arthroplasty were discussed at length. The risks and benefits of total knee arthroplasty were presented and reviewed. The risks due to aseptic loosening, infection, stiffness, patella tracking problems, thromboembolic complications and other imponderables were discussed. The patient acknowledged the explanation,  agreed to proceed with the plan and consent was signed. Patient is being admitted for inpatient treatment for surgery, pain control, PT, OT, prophylactic antibiotics, VTE prophylaxis, progressive ambulation and ADL's and discharge planning. The patient is planning to be discharged home with OPPT.   Therapy Plans: outpatient therapy At [facility] 02/24/23.  Disposition: Home with sister and friend Planned DVT Prophylaxis: aspirin 81mg  BID DME needed: walker. Ice machine today.  PCP: Cleared.  TXA: IV Allergies:  - Sulfa antibiotics - rash.  Anesthesia Concerns: None.  BMI: 36 Last HgbA1c: Not diabetic.  Other: - OSA, use CPAP - History of stomach ulcers.  - Avastin (bevacizumab) eye injections, okay to proceed with.  - Last left knee cortisone injection  10/14/22.  - Oxycodone, zofran.  [facility].  - NO NSAIDs - 02/07/23: Hgb 13.6, Cr. 0.86, K+ 4.4.    Patient's anticipated LOS is less than 2 midnights, meeting these requirements: - Younger than 31 - Lives within 1 hour of care - Has a competent adult at home to recover with post-op recover - NO history of  - Chronic pain requiring opiods  - Diabetes  - Coronary Artery Disease  - Heart failure  - Heart attack  - Stroke  - DVT/VTE  - Cardiac arrhythmia  - Respiratory Failure/COPD  - Renal failure  - Anemia  - Advanced Liver disease

## 2023-02-19 ENCOUNTER — Encounter (INDEPENDENT_AMBULATORY_CARE_PROVIDER_SITE_OTHER): Payer: Medicare HMO | Admitting: Ophthalmology

## 2023-02-19 DIAGNOSIS — H43813 Vitreous degeneration, bilateral: Secondary | ICD-10-CM

## 2023-02-19 DIAGNOSIS — H353221 Exudative age-related macular degeneration, left eye, with active choroidal neovascularization: Secondary | ICD-10-CM | POA: Diagnosis not present

## 2023-02-19 DIAGNOSIS — H33302 Unspecified retinal break, left eye: Secondary | ICD-10-CM

## 2023-02-19 DIAGNOSIS — H353112 Nonexudative age-related macular degeneration, right eye, intermediate dry stage: Secondary | ICD-10-CM

## 2023-02-19 NOTE — Anesthesia Preprocedure Evaluation (Addendum)
Anesthesia Evaluation  Patient identified by MRN, date of birth, ID band Patient awake    Reviewed: Allergy & Precautions, H&P , NPO status , Patient's Chart, lab work & pertinent test results  Airway Mallampati: II  TM Distance: >3 FB Neck ROM: Full    Dental no notable dental hx. (+) Teeth Intact, Dental Advisory Given   Pulmonary neg pulmonary ROS, sleep apnea and Continuous Positive Airway Pressure Ventilation    Pulmonary exam normal breath sounds clear to auscultation       Cardiovascular Exercise Tolerance: Good negative cardio ROS Normal cardiovascular exam Rhythm:Regular Rate:Normal     Neuro/Psych  Headaches  Anxiety     negative neurological ROS  negative psych ROS   GI/Hepatic negative GI ROS, Neg liver ROS,,,  Endo/Other  negative endocrine ROS    Renal/GU negative Renal ROS  negative genitourinary   Musculoskeletal negative musculoskeletal ROS (+) Arthritis , Osteoarthritis,    Abdominal   Peds negative pediatric ROS (+)  Hematology negative hematology ROS (+)   Anesthesia Other Findings   Reproductive/Obstetrics negative OB ROS                             Anesthesia Physical Anesthesia Plan  ASA: 2  Anesthesia Plan: Spinal, MAC and Regional   Post-op Pain Management: Regional block* and Minimal or no pain anticipated   Induction: Intravenous  PONV Risk Score and Plan: 2 and Treatment may vary due to age or medical condition, Propofol infusion, Midazolam and Ondansetron  Airway Management Planned: Natural Airway  Additional Equipment: None  Intra-op Plan:   Post-operative Plan:   Informed Consent: I have reviewed the patients History and Physical, chart, labs and discussed the procedure including the risks, benefits and alternatives for the proposed anesthesia with the patient or authorized representative who has indicated his/her understanding and acceptance.        Plan Discussed with: Anesthesiologist and CRNA  Anesthesia Plan Comments: (  )        Anesthesia Quick Evaluation

## 2023-02-20 ENCOUNTER — Ambulatory Visit (HOSPITAL_COMMUNITY)
Admission: RE | Admit: 2023-02-20 | Discharge: 2023-02-20 | Disposition: A | Discharge status: Home / Self Care | Payer: Medicare HMO | Attending: Orthopedic Surgery | Admitting: Orthopedic Surgery

## 2023-02-20 ENCOUNTER — Ambulatory Visit (HOSPITAL_BASED_OUTPATIENT_CLINIC_OR_DEPARTMENT_OTHER): Payer: Medicare HMO | Admitting: Certified Registered Nurse Anesthetist

## 2023-02-20 ENCOUNTER — Other Ambulatory Visit: Payer: Self-pay

## 2023-02-20 ENCOUNTER — Ambulatory Visit (HOSPITAL_COMMUNITY): Payer: Medicare HMO | Admitting: Certified Registered Nurse Anesthetist

## 2023-02-20 ENCOUNTER — Ambulatory Visit (HOSPITAL_COMMUNITY): Payer: Medicare HMO

## 2023-02-20 ENCOUNTER — Encounter (HOSPITAL_COMMUNITY): Payer: Self-pay | Admitting: Orthopedic Surgery

## 2023-02-20 ENCOUNTER — Other Ambulatory Visit (HOSPITAL_COMMUNITY): Payer: Self-pay

## 2023-02-20 ENCOUNTER — Encounter (HOSPITAL_COMMUNITY)
Admission: RE | Disposition: A | Discharge status: Home / Self Care | Payer: Self-pay | Source: Home / Self Care | Attending: Orthopedic Surgery

## 2023-02-20 DIAGNOSIS — M1712 Unilateral primary osteoarthritis, left knee: Secondary | ICD-10-CM

## 2023-02-20 DIAGNOSIS — G4733 Obstructive sleep apnea (adult) (pediatric): Secondary | ICD-10-CM | POA: Insufficient documentation

## 2023-02-20 DIAGNOSIS — F419 Anxiety disorder, unspecified: Secondary | ICD-10-CM | POA: Insufficient documentation

## 2023-02-20 DIAGNOSIS — G8918 Other acute postprocedural pain: Secondary | ICD-10-CM | POA: Diagnosis not present

## 2023-02-20 DIAGNOSIS — Z471 Aftercare following joint replacement surgery: Secondary | ICD-10-CM | POA: Diagnosis not present

## 2023-02-20 DIAGNOSIS — Z96652 Presence of left artificial knee joint: Secondary | ICD-10-CM | POA: Diagnosis not present

## 2023-02-20 HISTORY — PX: KNEE ARTHROPLASTY: SHX992

## 2023-02-20 SURGERY — ARTHROPLASTY, KNEE, TOTAL, USING IMAGELESS COMPUTER-ASSISTED NAVIGATION
Anesthesia: Monitor Anesthesia Care | Site: Knee | Laterality: Left

## 2023-02-20 MED ORDER — FENTANYL CITRATE PF 50 MCG/ML IJ SOSY: 25.0000 ug | PREFILLED_SYRINGE | INTRAMUSCULAR | Status: DC | PRN

## 2023-02-20 MED ORDER — SODIUM CHLORIDE (PF) 0.9 % IJ SOLN
INTRAMUSCULAR | Status: AC
  Filled 2023-02-20: qty 50

## 2023-02-20 MED ORDER — ONDANSETRON HCL 4 MG PO TABS
4.0000 mg | ORAL_TABLET | Freq: Three times a day (TID) | ORAL | 0 refills | Status: AC | PRN
  Filled 2023-02-20: qty 20, 7d supply, fill #0

## 2023-02-20 MED ORDER — OXYCODONE HCL 5 MG PO TABS
ORAL_TABLET | ORAL | Status: AC
  Administered 2023-02-20: 5 mg via ORAL
  Filled 2023-02-20: qty 1

## 2023-02-20 MED ORDER — BUPIVACAINE IN DEXTROSE 0.75-8.25 % IT SOLN
INTRATHECAL | Status: DC | PRN
  Administered 2023-02-20: 1.8 mL via INTRATHECAL

## 2023-02-20 MED ORDER — ASPIRIN 81 MG PO CHEW
81.0000 mg | CHEWABLE_TABLET | Freq: Two times a day (BID) | ORAL | 0 refills | Status: AC
  Filled 2023-02-20: qty 90, 45d supply, fill #0

## 2023-02-20 MED ORDER — POVIDONE-IODINE 10 % EX SWAB: 2.0000 | Freq: Once | CUTANEOUS | Status: DC

## 2023-02-20 MED ORDER — SODIUM CHLORIDE (PF) 0.9 % IJ SOLN
INTRAMUSCULAR | Status: DC | PRN
  Administered 2023-02-20: 30 mL

## 2023-02-20 MED ORDER — MEPERIDINE HCL 50 MG/ML IJ SOLN: 6.2500 mg | INTRAMUSCULAR | Status: DC | PRN

## 2023-02-20 MED ORDER — CEFAZOLIN SODIUM-DEXTROSE 2-4 GM/100ML-% IV SOLN: 2.0000 g | Freq: Four times a day (QID) | INTRAVENOUS | Status: DC

## 2023-02-20 MED ORDER — LACTATED RINGERS IV BOLUS
500.0000 mL | Freq: Once | INTRAVENOUS | Status: AC
  Administered 2023-02-20: 500 mL via INTRAVENOUS

## 2023-02-20 MED ORDER — PROPOFOL 10 MG/ML IV BOLUS
INTRAVENOUS | Status: DC | PRN
  Administered 2023-02-20: 30 mg via INTRAVENOUS

## 2023-02-20 MED ORDER — LACTATED RINGERS IV BOLUS
250.0000 mL | Freq: Once | INTRAVENOUS | Status: AC
  Administered 2023-02-20: 250 mL via INTRAVENOUS

## 2023-02-20 MED ORDER — BUPIVACAINE-EPINEPHRINE 0.25% -1:200000 IJ SOLN
INTRAMUSCULAR | Status: DC | PRN
  Administered 2023-02-20: 30 mL

## 2023-02-20 MED ORDER — FENTANYL CITRATE (PF) 100 MCG/2ML IJ SOLN
INTRAMUSCULAR | Status: DC | PRN
  Administered 2023-02-20 (×2): 50 ug via INTRAVENOUS

## 2023-02-20 MED ORDER — ACETAMINOPHEN 500 MG PO TABS
1000.0000 mg | ORAL_TABLET | Freq: Once | ORAL | Status: AC
  Administered 2023-02-20: 1000 mg via ORAL
  Filled 2023-02-20: qty 2

## 2023-02-20 MED ORDER — CEFAZOLIN SODIUM-DEXTROSE 2-4 GM/100ML-% IV SOLN
INTRAVENOUS | Status: AC
  Administered 2023-02-20: 2 g via INTRAVENOUS
  Filled 2023-02-20: qty 100

## 2023-02-20 MED ORDER — METHOCARBAMOL 500 MG IVPB - SIMPLE MED: 500.0000 mg | Freq: Four times a day (QID) | INTRAVENOUS | Status: DC | PRN

## 2023-02-20 MED ORDER — OXYCODONE HCL 5 MG PO TABS
ORAL_TABLET | ORAL | Status: AC
  Filled 2023-02-20: qty 1

## 2023-02-20 MED ORDER — 0.9 % SODIUM CHLORIDE (POUR BTL) OPTIME
TOPICAL | Status: DC | PRN
  Administered 2023-02-20: 1000 mL

## 2023-02-20 MED ORDER — EPINEPHRINE PF 1 MG/ML IJ SOLN
INTRAMUSCULAR | Status: AC
  Filled 2023-02-20: qty 1

## 2023-02-20 MED ORDER — OXYCODONE HCL 5 MG PO TABS
5.0000 mg | ORAL_TABLET | Freq: Once | ORAL | Status: AC | PRN
  Administered 2023-02-20: 5 mg via ORAL

## 2023-02-20 MED ORDER — KETOROLAC TROMETHAMINE 30 MG/ML IJ SOLN
INTRAMUSCULAR | Status: AC
  Filled 2023-02-20: qty 1

## 2023-02-20 MED ORDER — ONDANSETRON HCL 4 MG/2ML IJ SOLN
INTRAMUSCULAR | Status: AC
  Filled 2023-02-20: qty 2

## 2023-02-20 MED ORDER — KETOROLAC TROMETHAMINE 30 MG/ML IJ SOLN
INTRAMUSCULAR | Status: DC | PRN
  Administered 2023-02-20: 30 mg

## 2023-02-20 MED ORDER — ACETAMINOPHEN 500 MG PO TABS
1000.0000 mg | ORAL_TABLET | Freq: Three times a day (TID) | ORAL | 0 refills | Status: AC
  Filled 2023-02-20: qty 180, 30d supply, fill #0

## 2023-02-20 MED ORDER — METHOCARBAMOL 500 MG PO TABS
ORAL_TABLET | ORAL | Status: AC
  Filled 2023-02-20: qty 1

## 2023-02-20 MED ORDER — POLYETHYLENE GLYCOL 3350 17 GM/SCOOP PO POWD
17.0000 g | Freq: Every day | ORAL | 0 refills | Status: AC | PRN
  Filled 2023-02-20: qty 238, 14d supply, fill #0

## 2023-02-20 MED ORDER — CEFAZOLIN SODIUM-DEXTROSE 2-4 GM/100ML-% IV SOLN
2.0000 g | INTRAVENOUS | Status: AC
  Administered 2023-02-20: 2 g via INTRAVENOUS
  Filled 2023-02-20: qty 100

## 2023-02-20 MED ORDER — ACETAMINOPHEN 325 MG PO TABS: 325.0000 mg | ORAL_TABLET | ORAL | Status: DC | PRN

## 2023-02-20 MED ORDER — LACTATED RINGERS IV SOLN: INTRAVENOUS | Status: DC

## 2023-02-20 MED ORDER — SODIUM CHLORIDE 0.9 % IR SOLN
Status: DC | PRN
  Administered 2023-02-20: 4000 mL

## 2023-02-20 MED ORDER — ONDANSETRON HCL 4 MG/2ML IJ SOLN: 4.0000 mg | Freq: Once | INTRAMUSCULAR | Status: DC | PRN

## 2023-02-20 MED ORDER — METHOCARBAMOL 500 MG PO TABS
500.0000 mg | ORAL_TABLET | Freq: Four times a day (QID) | ORAL | Status: DC | PRN
  Administered 2023-02-20: 500 mg via ORAL

## 2023-02-20 MED ORDER — FENTANYL CITRATE PF 50 MCG/ML IJ SOSY: 100.0000 ug | PREFILLED_SYRINGE | Freq: Once | INTRAMUSCULAR | Status: DC

## 2023-02-20 MED ORDER — ACETAMINOPHEN 160 MG/5ML PO SOLN: 325.0000 mg | ORAL | Status: DC | PRN

## 2023-02-20 MED ORDER — OXYCODONE HCL 5 MG PO TABS: 5.0000 mg | ORAL_TABLET | ORAL | Status: DC | PRN

## 2023-02-20 MED ORDER — SENNA 8.6 MG PO TABS
2.0000 | ORAL_TABLET | Freq: Every day | ORAL | 1 refills | Status: AC
  Filled 2023-02-20: qty 60, 30d supply, fill #0

## 2023-02-20 MED ORDER — FENTANYL CITRATE (PF) 100 MCG/2ML IJ SOLN
INTRAMUSCULAR | Status: AC
  Filled 2023-02-20: qty 2

## 2023-02-20 MED ORDER — CHLORHEXIDINE GLUCONATE 0.12 % MT SOLN
15.0000 mL | Freq: Once | OROMUCOSAL | Status: AC
  Administered 2023-02-20: 15 mL via OROMUCOSAL

## 2023-02-20 MED ORDER — STERILE WATER FOR IRRIGATION IR SOLN
Status: DC | PRN
  Administered 2023-02-20: 2000 mL

## 2023-02-20 MED ORDER — PROPOFOL 1000 MG/100ML IV EMUL
INTRAVENOUS | Status: AC
  Filled 2023-02-20: qty 100

## 2023-02-20 MED ORDER — MIDAZOLAM HCL 2 MG/2ML IJ SOLN
2.0000 mg | Freq: Once | INTRAMUSCULAR | Status: AC
  Administered 2023-02-20 (×2): 1 mg via INTRAVENOUS

## 2023-02-20 MED ORDER — MIDAZOLAM HCL 2 MG/2ML IJ SOLN
INTRAMUSCULAR | Status: AC
  Filled 2023-02-20: qty 2

## 2023-02-20 MED ORDER — OXYCODONE HCL 5 MG PO TABS
5.0000 mg | ORAL_TABLET | ORAL | 0 refills | Status: AC | PRN
  Filled 2023-02-20: qty 42, 7d supply, fill #0

## 2023-02-20 MED ORDER — ORAL CARE MOUTH RINSE: 15.0000 mL | Freq: Once | OROMUCOSAL | Status: AC

## 2023-02-20 MED ORDER — ROPIVACAINE HCL 5 MG/ML IJ SOLN
INTRAMUSCULAR | Status: DC | PRN
  Administered 2023-02-20: 20 mL via PERINEURAL

## 2023-02-20 MED ORDER — TRANEXAMIC ACID-NACL 1000-0.7 MG/100ML-% IV SOLN
1000.0000 mg | INTRAVENOUS | Status: AC
  Administered 2023-02-20: 1000 mg via INTRAVENOUS
  Filled 2023-02-20: qty 100

## 2023-02-20 MED ORDER — PHENYLEPHRINE HCL-NACL 20-0.9 MG/250ML-% IV SOLN
INTRAVENOUS | Status: DC | PRN
  Administered 2023-02-20: 35 ug/min via INTRAVENOUS

## 2023-02-20 MED ORDER — ISOPROPYL ALCOHOL 70 % SOLN
Status: DC | PRN
  Administered 2023-02-20: 1 via TOPICAL

## 2023-02-20 MED ORDER — OXYCODONE HCL 5 MG/5ML PO SOLN: 5.0000 mg | Freq: Once | ORAL | Status: AC | PRN

## 2023-02-20 MED ORDER — OXYCODONE HCL 5 MG PO TABS: 10.0000 mg | ORAL_TABLET | ORAL | Status: DC | PRN

## 2023-02-20 MED ORDER — PROPOFOL 500 MG/50ML IV EMUL
INTRAVENOUS | Status: DC | PRN
  Administered 2023-02-20: 100 ug/kg/min via INTRAVENOUS

## 2023-02-20 MED ORDER — PROPOFOL 500 MG/50ML IV EMUL
INTRAVENOUS | Status: AC
  Filled 2023-02-20: qty 50

## 2023-02-20 MED ORDER — BUPIVACAINE HCL (PF) 0.25 % IJ SOLN
INTRAMUSCULAR | Status: AC
  Filled 2023-02-20: qty 30

## 2023-02-20 MED ORDER — DOCUSATE SODIUM 100 MG PO CAPS
100.0000 mg | ORAL_CAPSULE | Freq: Two times a day (BID) | ORAL | 1 refills | Status: AC
  Filled 2023-02-20: qty 60, 30d supply, fill #0

## 2023-02-20 SURGICAL SUPPLY — 76 items
ADH SKN CLS APL DERMABOND .7 (GAUZE/BANDAGES/DRESSINGS) ×2
APL PRP STRL LF DISP 70% ISPRP (MISCELLANEOUS) ×2
BAG COUNTER SPONGE SURGICOUNT (BAG) IMPLANT
BAG SPEC THK2 15X12 ZIP CLS (MISCELLANEOUS) ×1
BAG SPNG CNTER NS LX DISP (BAG) ×1
BAG ZIPLOCK 12X15 (MISCELLANEOUS) IMPLANT
BATTERY INSTRU NAVIGATION (MISCELLANEOUS) ×3 IMPLANT
BLADE SAW RECIPROCATING 77.5 (BLADE) ×1 IMPLANT
BNDG CMPR 5X4 CHSV STRCH STRL (GAUZE/BANDAGES/DRESSINGS) ×1
BNDG CMPR 5X4 KNIT ELC UNQ LF (GAUZE/BANDAGES/DRESSINGS) ×1
BNDG CMPR 5X6 CHSV STRCH STRL (GAUZE/BANDAGES/DRESSINGS) ×1
BNDG CMPR 6 X 5 YARDS HK CLSR (GAUZE/BANDAGES/DRESSINGS) ×1
BNDG CMPR 6"X 5 YARDS HK CLSR (GAUZE/BANDAGES/DRESSINGS) ×1
BNDG COHESIVE 4X5 TAN STRL LF (GAUZE/BANDAGES/DRESSINGS) IMPLANT
BNDG COHESIVE 6X5 TAN ST LF (GAUZE/BANDAGES/DRESSINGS) IMPLANT
BNDG ELASTIC 4INX 5YD STR LF (GAUZE/BANDAGES/DRESSINGS) ×1 IMPLANT
BNDG ELASTIC 6INX 5YD STR LF (GAUZE/BANDAGES/DRESSINGS) ×1 IMPLANT
BTRY SRG DRVR LF (MISCELLANEOUS) ×3
CHLORAPREP W/TINT 26 (MISCELLANEOUS) ×2 IMPLANT
COMP FEM PS KNEE NRW 9 LT (Joint) ×1 IMPLANT
COMP PATELLA 3 PEG 35 (Joint) ×1 IMPLANT
COMPONENT FEM PS KNEE NRW 9 LT (Joint) IMPLANT
COMPONENT PATELLA 3 PEG 35 (Joint) IMPLANT
COVER SURGICAL LIGHT HANDLE (MISCELLANEOUS) ×1 IMPLANT
DERMABOND ADVANCED .7 DNX12 (GAUZE/BANDAGES/DRESSINGS) ×2 IMPLANT
DRAPE SHEET LG 3/4 BI-LAMINATE (DRAPES) ×3 IMPLANT
DRAPE U-SHAPE 47X51 STRL (DRAPES) ×1 IMPLANT
DRSG AQUACEL AG ADV 3.5X10 (GAUZE/BANDAGES/DRESSINGS) ×1 IMPLANT
ELECT BLADE TIP CTD 4 INCH (ELECTRODE) ×1 IMPLANT
ELECT REM PT RETURN 15FT ADLT (MISCELLANEOUS) ×1 IMPLANT
GAUZE SPONGE 4X4 12PLY STRL (GAUZE/BANDAGES/DRESSINGS) ×1 IMPLANT
GLOVE BIO SURGEON STRL SZ7 (GLOVE) ×1 IMPLANT
GLOVE BIO SURGEON STRL SZ8.5 (GLOVE) ×2 IMPLANT
GLOVE BIOGEL PI IND STRL 7.5 (GLOVE) ×1 IMPLANT
GLOVE BIOGEL PI IND STRL 8.5 (GLOVE) ×1 IMPLANT
GOWN SPEC L3 XXLG W/TWL (GOWN DISPOSABLE) ×1 IMPLANT
GOWN STRL REUS W/ TWL XL LVL3 (GOWN DISPOSABLE) ×1 IMPLANT
GOWN STRL REUS W/TWL XL LVL3 (GOWN DISPOSABLE) ×1
HANDPIECE INTERPULSE COAX TIP (DISPOSABLE) ×1
HOLDER FOLEY CATH W/STRAP (MISCELLANEOUS) ×1 IMPLANT
HOOD PEEL AWAY T7 (MISCELLANEOUS) ×3 IMPLANT
KIT TURNOVER KIT A (KITS) IMPLANT
LINER TIB PS CD/3-9 10 LT (Liner) IMPLANT
MARKER SKIN DUAL TIP RULER LAB (MISCELLANEOUS) ×1 IMPLANT
NDL SAFETY ECLIP 18X1.5 (MISCELLANEOUS) ×1 IMPLANT
NDL SPNL 18GX3.5 QUINCKE PK (NEEDLE) ×1 IMPLANT
NEEDLE SPNL 18GX3.5 QUINCKE PK (NEEDLE) ×1 IMPLANT
NS IRRIG 1000ML POUR BTL (IV SOLUTION) ×1 IMPLANT
PACK TOTAL KNEE CUSTOM (KITS) ×1 IMPLANT
PADDING CAST COTTON 6X4 STRL (CAST SUPPLIES) ×1 IMPLANT
PIN DRILL HDLS TROCAR 75 4PK (PIN) IMPLANT
PROTECTOR NERVE ULNAR (MISCELLANEOUS) ×1 IMPLANT
SAW OSC TIP CART 19.5X105X1.3 (SAW) ×1 IMPLANT
SCREW FEMALE HEX FIX 25X2.5 (ORTHOPEDIC DISPOSABLE SUPPLIES) IMPLANT
SEALER BIPOLAR AQUA 6.0 (INSTRUMENTS) ×1 IMPLANT
SET HNDPC FAN SPRY TIP SCT (DISPOSABLE) ×1 IMPLANT
SET PAD KNEE POSITIONER (MISCELLANEOUS) ×1 IMPLANT
SOLUTION PRONTOSAN WOUND 350ML (IRRIGATION / IRRIGATOR) IMPLANT
SPIKE FLUID TRANSFER (MISCELLANEOUS) ×2 IMPLANT
STEM TIB PS KNEE D 0D LT (Stem) IMPLANT
SUT MNCRL AB 3-0 PS2 18 (SUTURE) ×1 IMPLANT
SUT MON AB 2-0 CT1 36 (SUTURE) ×1 IMPLANT
SUT STRATAFIX PDO 1 14 VIOLET (SUTURE) ×1
SUT STRATFX PDO 1 14 VIOLET (SUTURE) ×1
SUT VIC AB 1 CTX 36 (SUTURE) ×2
SUT VIC AB 1 CTX36XBRD ANBCTR (SUTURE) ×2 IMPLANT
SUT VIC AB 2-0 CT1 27 (SUTURE) ×1
SUT VIC AB 2-0 CT1 TAPERPNT 27 (SUTURE) ×1 IMPLANT
SUTURE STRATFX PDO 1 14 VIOLET (SUTURE) ×1 IMPLANT
SYR 3ML LL SCALE MARK (SYRINGE) ×1 IMPLANT
TRAY FOL W/BAG SLVR 16FR STRL (SET/KITS/TRAYS/PACK) IMPLANT
TRAY FOLEY MTR SLVR 16FR STAT (SET/KITS/TRAYS/PACK) IMPLANT
TRAY FOLEY W/BAG SLVR 16FR LF (SET/KITS/TRAYS/PACK) ×1
TUBE SUCTION HIGH CAP CLEAR NV (SUCTIONS) ×1 IMPLANT
WATER STERILE IRR 1000ML POUR (IV SOLUTION) ×2 IMPLANT
WRAP KNEE MAXI GEL POST OP (GAUZE/BANDAGES/DRESSINGS) IMPLANT

## 2023-02-20 NOTE — Interval H&P Note (Signed)
History and Physical Interval Note:  02/20/2023 6:55 AM  Cheyenne Miller  has presented today for surgery, with the diagnosis of Left knee osteoarthritis.  The various methods of treatment have been discussed with the patient and family. After consideration of risks, benefits and other options for treatment, the patient has consented to  Procedure(s) with comments: COMPUTER ASSISTED TOTAL KNEE ARTHROPLASTY (Left) - 160 as a surgical intervention.  The patient's history has been reviewed, patient examined, no change in status, stable for surgery.  I have reviewed the patient's chart and labs.  Questions were answered to the patient's satisfaction.     Cheyenne Miller

## 2023-02-20 NOTE — Anesthesia Procedure Notes (Signed)
Procedure Name: MAC Date/Time: 02/20/2023 7:33 AM  Performed by: Orest Dikes, CRNAPre-anesthesia Checklist: Patient identified, Emergency Drugs available, Suction available and Patient being monitored Oxygen Delivery Method: Simple face mask

## 2023-02-20 NOTE — Transfer of Care (Signed)
Immediate Anesthesia Transfer of Care Note  Patient: Cheyenne Miller  Procedure(s) Performed: COMPUTER ASSISTED TOTAL KNEE ARTHROPLASTY (Left: Knee)  Patient Location: PACU  Anesthesia Type:Spinal  Level of Consciousness: awake, alert , and oriented  Airway & Oxygen Therapy: Patient Spontanous Breathing and Patient connected to face mask oxygen  Post-op Assessment: Report given to RN and Post -op Vital signs reviewed and stable  Post vital signs: Reviewed and stable  Last Vitals:  Vitals Value Taken Time  BP 88/62 02/20/23 1000  Temp 36.5 C 02/20/23 0955  Pulse 71 02/20/23 1003  Resp 15 02/20/23 1003  SpO2 99 % 02/20/23 1003  Vitals shown include unfiled device data.  Last Pain:  Vitals:   02/20/23 0626  TempSrc: Oral  PainSc:          Complications: No notable events documented.

## 2023-02-20 NOTE — Anesthesia Procedure Notes (Signed)
Anesthesia Regional Block: Adductor canal block   Pre-Anesthetic Checklist: , timeout performed,  Correct Patient, Correct Site, Correct Laterality,  Correct Procedure, Correct Position, site marked,  Risks and benefits discussed,  Surgical consent,  Pre-op evaluation,  At surgeon's request and post-op pain management  Laterality: Left  Prep: chloraprep       Needles:  Injection technique: Single-shot  Needle Type: Echogenic Stimulator Needle     Needle Length: 5cm  Needle Gauge: 22     Additional Needles:   Procedures:, nerve stimulator,,, ultrasound used (permanent image in chart),,     Nerve Stimulator or Paresthesia:  Response: 0.45 mA  Additional Responses:   Narrative:  Start time: 02/20/2023 7:00 AM End time: 02/20/2023 7:05 AM Injection made incrementally with aspirations every 5 mL.  Performed by: Personally  Anesthesiologist: Bethena Midget, MD  Additional Notes: Functioning IV was confirmed and monitors were applied.  A 50mm 22ga Arrow echogenic stimulator needle was used. Sterile prep and drape,hand hygiene and sterile gloves were used. Ultrasound guidance: relevant anatomy identified, needle position confirmed, local anesthetic spread visualized around nerve(s)., vascular puncture avoided.  Image printed for medical record. Negative aspiration and negative test dose prior to incremental administration of local anesthetic. The patient tolerated the procedure well.

## 2023-02-20 NOTE — Anesthesia Postprocedure Evaluation (Signed)
Anesthesia Post Note  Patient: Cheyenne Miller  Procedure(s) Performed: COMPUTER ASSISTED TOTAL KNEE ARTHROPLASTY (Left: Knee)     Patient location during evaluation: PACU Anesthesia Type: Regional, Spinal and MAC Level of consciousness: oriented and awake and alert Pain management: pain level controlled Vital Signs Assessment: post-procedure vital signs reviewed and stable Respiratory status: spontaneous breathing, respiratory function stable and patient connected to nasal cannula oxygen Cardiovascular status: blood pressure returned to baseline and stable Postop Assessment: no headache, no backache and no apparent nausea or vomiting Anesthetic complications: no   No notable events documented.  Last Vitals:  Vitals:   02/20/23 0955 02/20/23 1015  BP: 93/63 96/69  Pulse: 65 66  Resp: (!) 7 (!) 9  Temp: 36.5 C   SpO2: 100% 97%    Last Pain:  Vitals:   02/20/23 0955  TempSrc:   PainSc: 0-No pain                 Alyjah Lovingood

## 2023-02-20 NOTE — Anesthesia Procedure Notes (Signed)
Spinal  Patient location during procedure: OR Start time: 02/20/2023 7:33 AM End time: 02/20/2023 7:43 AM Reason for block: surgical anesthesia Staffing Anesthesiologist: Bethena Midget, MD Performed by: Bethena Midget, MD Authorized by: Bethena Midget, MD   Preanesthetic Checklist Completed: patient identified, IV checked, site marked, risks and benefits discussed, surgical consent, monitors and equipment checked, pre-op evaluation and timeout performed Spinal Block Patient position: sitting Prep: DuraPrep Patient monitoring: heart rate, cardiac monitor, continuous pulse ox and blood pressure Approach: midline Location: L3-4 Injection technique: single-shot Needle Needle type: Sprotte  Needle gauge: 24 G Needle length: 9 cm Assessment Sensory level: T4 Events: CSF return

## 2023-02-20 NOTE — Evaluation (Signed)
Physical Therapy Evaluation Patient Details Name: Cheyenne Miller MRN: [id number] DOB: 09-Jul-1956 Today's Date: 02/20/2023  History of Present Illness  Pt s/p L TKR  Clinical Impression  Pt s/p L TKR and presents with decreased L LE strength/ROM and post op pain limiting functional mobility.  This date, pt performed HEP with written instruction provided, ambulated in hall and negotiated stairs.  Pt eager for dc home this date.      Assistance Recommended at Discharge Intermittent Supervision/Assistance  If plan is discharge home, recommend the following:  Can travel by private vehicle  A little help with walking and/or transfers;A little help with bathing/dressing/bathroom;Assistance with cooking/housework;Assist for transportation;Help with stairs or ramp for entrance        Equipment Recommendations Rolling walker (2 wheels)  Recommendations for Other Services       Functional Status Assessment Patient has had a recent decline in their functional status and demonstrates the ability to make significant improvements in function in a reasonable and predictable amount of time.     Precautions / Restrictions Precautions Precautions: Knee;Fall Restrictions Weight Bearing Restrictions: No LLE Weight Bearing: Weight bearing as tolerated      Mobility  Bed Mobility Overal bed mobility: Needs Assistance Bed Mobility: Supine to Sit     Supine to sit: Min guard, HOB elevated     General bed mobility comments: for safety    Transfers Overall transfer level: Needs assistance Equipment used: Rolling walker (2 wheels) Transfers: Sit to/from Stand Sit to Stand: Min guard           General transfer comment: Steady assist with cues for LE management and use of UEs to self assist    Ambulation/Gait Ambulation/Gait assistance: Min assist, Min guard Gait Distance (Feet): 85 Feet Assistive device: Rolling walker (2 wheels) Gait Pattern/deviations: Step-to pattern,  Decreased step length - right, Decreased step length - left, Shuffle, Trunk flexed Gait velocity: decr     General Gait Details: cues for sequence, posture and position from RW  Stairs Stairs: Yes Stairs assistance: Min assist Stair Management: One rail Left, Step to pattern, Forwards, With crutches Number of Stairs: 3 General stair comments: cues for sequence and foot/crutch placement  Wheelchair Mobility     Tilt Bed    Modified Rankin (Stroke Patients Only)       Balance Overall balance assessment: Needs assistance Sitting-balance support: No upper extremity supported, Feet supported Sitting balance-Leahy Scale: Good     Standing balance support: Single extremity supported Standing balance-Leahy Scale: Poor                               Pertinent Vitals/Pain Pain Assessment Pain Assessment: 0-10 Pain Score: 7  Pain Location: L knee Pain Descriptors / Indicators: Aching, Sore Pain Intervention(s): Limited activity within patient's tolerance, Monitored during session, Premedicated before session    Home Living Family/patient expects to be discharged to:: Private residence Living Arrangements: Alone Available Help at Discharge: Family;Friend(s);Available 24 hours/day Type of Home: House Home Access: Stairs to enter Entrance Stairs-Rails: Doctor, general practice of Steps: 2 Alternate Level Stairs-Number of Steps: 13 Home Layout: Two level Home Equipment: None Additional Comments: Pt states can borrow cane and crutch from neighbor    Prior Function Prior Level of Function : Independent/Modified Independent                     Hand Dominance  Extremity/Trunk Assessment   Upper Extremity Assessment Upper Extremity Assessment: Overall WFL for tasks assessed    Lower Extremity Assessment Lower Extremity Assessment: LLE deficits/detail LLE Deficits / Details: IND SLR with AAROM at knee -5 - 60    Cervical / Trunk  Assessment Cervical / Trunk Assessment: Normal  Communication   Communication: No difficulties  Cognition Arousal/Alertness: Awake/alert Behavior During Therapy: WFL for tasks assessed/performed Overall Cognitive Status: Within Functional Limits for tasks assessed                                          General Comments      Exercises Total Joint Exercises Ankle Circles/Pumps: AROM, Both, 15 reps, Supine Quad Sets: AROM, Both, 10 reps, Supine Heel Slides: AAROM, Left, 15 reps, Supine Straight Leg Raises: AAROM, AROM, Left, 10 reps, Supine Long Arc Quad: AAROM, Left, 10 reps, Seated   Assessment/Plan    PT Assessment Patient needs continued PT services  PT Problem List Decreased strength;Decreased range of motion;Decreased activity tolerance;Decreased balance;Decreased mobility;Decreased knowledge of use of DME;Pain       PT Treatment Interventions Stair training;Functional mobility training;DME instruction;Gait training;Therapeutic activities;Therapeutic exercise;Patient/family education    PT Goals (Current goals can be found in the Care Plan section)  Acute Rehab PT Goals Patient Stated Goal: REgain IND PT Goal Formulation: With patient Time For Goal Achievement: 02/27/23 Potential to Achieve Goals: Good    Frequency 7X/week     Co-evaluation               AM-PAC PT "6 Clicks" Mobility  Outcome Measure Help needed turning from your back to your side while in a flat bed without using bedrails?: A Little Help needed moving from lying on your back to sitting on the side of a flat bed without using bedrails?: A Little Help needed moving to and from a bed to a chair (including a wheelchair)?: A Little Help needed standing up from a chair using your arms (e.g., wheelchair or bedside chair)?: A Little Help needed to walk in hospital room?: A Little Help needed climbing 3-5 steps with a railing? : A Little 6 Click Score: 18    End of Session  Equipment Utilized During Treatment: Gait belt Activity Tolerance: Patient tolerated treatment well Patient left: in chair;with call bell/phone within reach Nurse Communication: Mobility status PT Visit Diagnosis: Difficulty in walking, not elsewhere classified (R26.2)    Time: 4696-2952 PT Time Calculation (min) (ACUTE ONLY): 40 min   Charges:   PT Evaluation $PT Eval Low Complexity: 1 Low PT Treatments $Gait Training: 8-22 mins $Therapeutic Exercise: 8-22 mins PT General Charges $$ ACUTE PT VISIT: 1 Visit         Cheyenne Miller PT Acute Rehabilitation Services Pager [phone] Office [phone]   Cheyenne Miller 02/20/2023, 1:21 PM

## 2023-02-20 NOTE — Care Plan (Signed)
Ortho Bundle Case Management Note  Patient Details  Name: Cheyenne Miller MRN: [id number] Date of Birth: [birth year]                  L TKA on 02-20-23  DCP: Home with sister and friend  DME: RW ordered [facility]  PT: EO 02-24-23 at 10:00am   DME Arranged:  Dan Humphreys rolling DME Agency:  [employer]    Additional Comments: Please contact me with any questions of if this plan should need to change.   Ennis Forts, RN,CCM [facility]  [phone] 02/20/2023, 8:19 AM

## 2023-02-20 NOTE — Op Note (Signed)
OPERATIVE REPORT  SURGEON: Samson Frederic, MD   ASSISTANT: Clint Bolder, PA-C  PREOPERATIVE DIAGNOSIS: Primary Left knee arthritis.   POSTOPERATIVE DIAGNOSIS: Primary Left knee arthritis.   PROCEDURE: Computer assisted Left total knee arthroplasty.   IMPLANTS: Zimmer Persona PPS Cementless CR femur, size 9 Narrow. Persona 0 degree Spiked Keel OsseoTi Tibia, size D. Vivacit-E polyethelyene insert, size 10 mm, CR. OsseoTi 3-Peg patella, size 35 mm.  ANESTHESIA:  MAC, Regional, and Spinal  TOURNIQUET TIME: Not utilized.   ESTIMATED BLOOD LOSS:-250 mL    ANTIBIOTICS: 2g Ancef.  DRAINS: None.  COMPLICATIONS: None   CONDITION: PACU - hemodynamically stable.   BRIEF CLINICAL NOTE: Cheyenne Miller is a 67 y.o. female with a long-standing history of Left knee arthritis. After failing conservative management, the patient was indicated for total knee arthroplasty. The risks, benefits, and alternatives to the procedure were explained, and the patient elected to proceed.  PROCEDURE IN DETAIL: Adductor canal block was obtained in the pre-op holding area. Once inside the operative room, spinal anesthesia was obtained, and a foley catheter was inserted. The patient was then positioned and the lower extremity was prepped and draped in the normal sterile surgical fashion.  A time-out was called verifying side and site of surgery. The patient received IV antibiotics within 60 minutes of beginning the procedure. A tourniquet was not utilized.   An anterior approach to the knee was performed utilizing a midvastus arthrotomy. A medial release was performed and the patellar fat pad was excised. Stryker imageless navigation was used to cut the distal femur perpendicular to the mechanical axis. A freehand patellar resection was performed, and the patella was sized an prepared with 3 lug holes.  Nagivation was used to make a neutral proximal tibia resection, taking 10 mm of bone from the less affected  lateral side with 3 degrees of slope. The menisci were excised. A spacer block was placed, and the alignment and balance in extension were confirmed.   The distal femur was sized using the 3-degree external rotation guide referencing the posterior femoral cortex. The appropriate 4-in-1 cutting block was pinned into place. Rotation was checked using Whiteside's line, the epicondylar axis, and then confirmed with a spacer block in flexion. The remaining femoral cuts were performed, taking care to protect the MCL.  The tibia was sized and the trial tray was pinned into place. The remaining trail components were inserted. The knee was stable to varus and valgus stress through a full range of motion. The patella tracked centrally, and the PCL was well balanced. The trial components were removed, and the proximal tibial surface was prepared. Final components were impacted into place. The knee was tested for a final time and found to be well balanced.   The wound was copiously irrigated with Prontosan solution and normal saline using pulse lavage.  Marcaine solution was injected into the periarticular soft tissue.  The wound was closed in layers using #1 Vicryl and Stratafix for the fascia, 2-0 Vicryl for the subcutaneous fat, 2-0 Monocryl for the deep dermal layer, 3-0 running Monocryl subcuticular Stitch, and 4-0 Monocryl stay sutures at both ends of the wound. Dermabond was applied to the skin.  Once the glue was fully dried, an Aquacell Ag and compressive dressing were applied.  The patient was transported to the recovery room in stable condition.  Sponge, needle, and instrument counts were correct at the end of the case x2.  The patient tolerated the procedure well and there were no known  complications.  The aquamantis was utilized for this case to help facilitate better hemostasis as patient was felt to be at increased risk of bleeding because of complex case requiring increased OR time and/or exposure -  minimally invasive approach.  A oscillating saw tip was utilized for this case to prevent damage to the soft tissue structures such as muscles, ligaments and tendons, and to ensure accurate bone cuts. This patient was at increased risk for above structures due to  minimally invasive approach.  Please note that a surgical assistant was a medical necessity for this procedure in order to perform it in a safe and expeditious manner. Surgical assistant was necessary to retract the ligaments and vital neurovascular structures to prevent injury to them and also necessary for proper positioning of the limb to allow for anatomic placement of the prosthesis.

## 2023-02-20 NOTE — Discharge Instructions (Signed)
 Dr. Brian Swinteck Total Joint Specialist [facility] [address] [place], [place] [address] [phone]  TOTAL KNEE REPLACEMENT POSTOPERATIVE DIRECTIONS    Knee Rehabilitation, Guidelines Following Surgery  Results after knee surgery are often greatly improved when you follow the exercise, range of motion and muscle strengthening exercises prescribed by your doctor. Safety measures are also important to protect the knee from further injury. Any time any of these exercises cause you to have increased pain or swelling in your knee joint, decrease the amount until you are comfortable again and slowly increase them. If you have problems or questions, call your caregiver or physical therapist for advice.   WEIGHT BEARING Weight bearing as tolerated with assist device (walker, cane, etc) as directed, use it as long as suggested by your surgeon or therapist, typically at least 4-6 weeks.  HOME CARE INSTRUCTIONS  Remove items at home which could result in a fall. This includes throw rugs or furniture in walking pathways.  Continue medications as instructed at time of discharge. You may have some home medications which will be placed on hold until you complete the course of blood thinner medication.  You may start showering once you are discharged home but do not submerge the incision under water. Just pat the incision dry and apply a dry gauze dressing on daily. Walk with walker as instructed.  You may resume a sexual relationship in one month or when given the OK by your doctor.  Use walker as long as suggested by your caregivers. Avoid periods of inactivity such as sitting longer than an hour when not asleep. This helps prevent blood clots.  You may put full weight on your legs and walk as much as is comfortable.  You may return to work once you are cleared by your doctor.  Do not drive a car for 6 weeks or until released by you surgeon.  Do not drive while  taking narcotics.  Wear the elastic stockings for three weeks following surgery during the day but you may remove then at night. Make sure you keep all of your appointments after your operation with all of your doctors and caregivers. You should call the office at the above phone number and make an appointment for approximately two weeks after the date of your surgery. Do not remove your surgical dressing. The dressing is waterproof; you may take showers in 3 days, but do not take tub baths or submerge the dressing. Please pick up a stool softener and laxative for home use as long as you are requiring pain medications. ICE to the affected knee every three hours for 30 minutes at a time and then as needed for pain and swelling.  Continue to use ice on the knee for pain and swelling from surgery. You may notice swelling that will progress down to the foot and ankle.  This is normal after surgery.  Elevate the leg when you are not up walking on it.   It is important for you to complete the blood thinner medication as prescribed by your doctor. Continue to use the breathing machine which will help keep your temperature down.  It is common for your temperature to cycle up and down following surgery, especially at night when you are not up moving around and exerting yourself.  The breathing machine keeps your lungs expanded and your temperature down.  RANGE OF MOTION AND STRENGTHENING EXERCISES  Rehabilitation of the knee is important following a knee injury or an   operation. After just a few days of immobilization, the muscles of the thigh which control the knee become weakened and shrink (atrophy). Knee exercises are designed to build up the tone and strength of the thigh muscles and to improve knee motion. Often times heat used for twenty to thirty minutes before working out will loosen up your tissues and help with improving the range of motion but do not use heat for the first two weeks following surgery.  These exercises can be done on a training (exercise) mat, on the floor, on a table or on a bed. Use what ever works the best and is most comfortable for you Knee exercises include:  Leg Lifts - While your knee is still immobilized in a splint or cast, you can do straight leg raises. Lift the leg to 60 degrees, hold for 3 sec, and slowly lower the leg. Repeat 10-20 times 2-3 times daily. Perform this exercise against resistance later as your knee gets better.  Quad and Hamstring Sets - Tighten up the muscle on the front of the thigh (Quad) and hold for 5-10 sec. Repeat this 10-20 times hourly. Hamstring sets are done by pushing the foot backward against an object and holding for 5-10 sec. Repeat as with quad sets.  A rehabilitation program following serious knee injuries can speed recovery and prevent re-injury in the future due to weakened muscles. Contact your doctor or a physical therapist for more information on knee rehabilitation.   POST-OPERATIVE OPIOID TAPER INSTRUCTIONS: It is important to wean off of your opioid medication as soon as possible. If you do not need pain medication after your surgery it is ok to stop day one. Opioids include: Codeine, Hydrocodone(Norco, Vicodin), Oxycodone(Percocet, oxycontin) and hydromorphone amongst others.  Long term and even short term use of opiods can cause: Increased pain response Dependence Constipation Depression Respiratory depression And more.  Withdrawal symptoms can include Flu like symptoms Nausea, vomiting And more Techniques to manage these symptoms Hydrate well Eat regular healthy meals Stay active Use relaxation techniques(deep breathing, meditating, yoga) Do Not substitute Alcohol to help with tapering If you have been on opioids for less than two weeks and do not have pain than it is ok to stop all together.  Plan to wean off of opioids This plan should start within one week post op of your joint replacement. Maintain the same  interval or time between taking each dose and first decrease the dose.  Cut the total daily intake of opioids by one tablet each day Next start to increase the time between doses. The last dose that should be eliminated is the evening dose.    SKILLED REHAB INSTRUCTIONS: If the patient is transferred to a skilled rehab facility following release from the hospital, a list of the current medications will be sent to the facility for the patient to continue.  When discharged from the skilled rehab facility, please have the facility set up the patient's Home Health Physical Therapy prior to being released. Also, the skilled facility will be responsible for providing the patient with their medications at time of release from the facility to include their pain medication, the muscle relaxants, and their blood thinner medication. If the patient is still at the rehab facility at time of the two week follow up appointment, the skilled rehab facility will also need to assist the patient in arranging follow up appointment in our office and any transportation needs.  MAKE SURE YOU:  Understand these instructions.  Will watch   your condition.  Will get help right away if you are not doing well or get worse.    Pick up stool softner and laxative for home use following surgery while on pain medications. Do NOT remove your dressing. You may shower.  Do not take tub baths or submerge incision under water. May shower starting three days after surgery. Please use a clean towel to pat the incision dry following showers. Continue to use ice for pain and swelling after surgery. Do not use any lotions or creams on the incision until instructed by your surgeon.  

## 2023-02-24 ENCOUNTER — Encounter (HOSPITAL_COMMUNITY): Payer: Self-pay | Admitting: Orthopedic Surgery

## 2023-02-24 DIAGNOSIS — M25562 Pain in left knee: Secondary | ICD-10-CM | POA: Diagnosis not present

## 2023-02-25 DIAGNOSIS — G4733 Obstructive sleep apnea (adult) (pediatric): Secondary | ICD-10-CM | POA: Diagnosis not present

## 2023-02-26 DIAGNOSIS — M25562 Pain in left knee: Secondary | ICD-10-CM | POA: Diagnosis not present

## 2023-02-27 ENCOUNTER — Encounter (INDEPENDENT_AMBULATORY_CARE_PROVIDER_SITE_OTHER): Payer: Medicare HMO | Admitting: Ophthalmology

## 2023-02-28 DIAGNOSIS — M25562 Pain in left knee: Secondary | ICD-10-CM | POA: Diagnosis not present

## 2023-03-03 DIAGNOSIS — Z96652 Presence of left artificial knee joint: Secondary | ICD-10-CM | POA: Diagnosis not present

## 2023-03-03 DIAGNOSIS — Z471 Aftercare following joint replacement surgery: Secondary | ICD-10-CM | POA: Diagnosis not present

## 2023-03-05 DIAGNOSIS — M25562 Pain in left knee: Secondary | ICD-10-CM | POA: Diagnosis not present

## 2023-03-07 DIAGNOSIS — M25562 Pain in left knee: Secondary | ICD-10-CM | POA: Diagnosis not present

## 2023-03-12 DIAGNOSIS — M25562 Pain in left knee: Secondary | ICD-10-CM | POA: Diagnosis not present

## 2023-03-13 ENCOUNTER — Encounter (INDEPENDENT_AMBULATORY_CARE_PROVIDER_SITE_OTHER): Payer: Medicare HMO | Admitting: Ophthalmology

## 2023-03-31 ENCOUNTER — Encounter (INDEPENDENT_AMBULATORY_CARE_PROVIDER_SITE_OTHER): Payer: Medicare HMO | Admitting: Ophthalmology

## 2023-03-31 DIAGNOSIS — H353221 Exudative age-related macular degeneration, left eye, with active choroidal neovascularization: Secondary | ICD-10-CM | POA: Diagnosis not present

## 2023-03-31 DIAGNOSIS — H353112 Nonexudative age-related macular degeneration, right eye, intermediate dry stage: Secondary | ICD-10-CM | POA: Diagnosis not present

## 2023-03-31 DIAGNOSIS — H43813 Vitreous degeneration, bilateral: Secondary | ICD-10-CM | POA: Diagnosis not present

## 2023-03-31 DIAGNOSIS — H33302 Unspecified retinal break, left eye: Secondary | ICD-10-CM | POA: Diagnosis not present

## 2023-04-01 DIAGNOSIS — Z96652 Presence of left artificial knee joint: Secondary | ICD-10-CM | POA: Diagnosis not present

## 2023-04-01 DIAGNOSIS — Z471 Aftercare following joint replacement surgery: Secondary | ICD-10-CM | POA: Diagnosis not present

## 2023-04-01 DIAGNOSIS — L578 Other skin changes due to chronic exposure to nonionizing radiation: Secondary | ICD-10-CM | POA: Diagnosis not present

## 2023-04-01 DIAGNOSIS — D225 Melanocytic nevi of trunk: Secondary | ICD-10-CM | POA: Diagnosis not present

## 2023-04-01 DIAGNOSIS — M25562 Pain in left knee: Secondary | ICD-10-CM | POA: Diagnosis not present

## 2023-04-01 DIAGNOSIS — L570 Actinic keratosis: Secondary | ICD-10-CM | POA: Diagnosis not present

## 2023-04-01 DIAGNOSIS — D2230 Melanocytic nevi of unspecified part of face: Secondary | ICD-10-CM | POA: Diagnosis not present

## 2023-04-01 DIAGNOSIS — L814 Other melanin hyperpigmentation: Secondary | ICD-10-CM | POA: Diagnosis not present

## 2023-04-01 DIAGNOSIS — L821 Other seborrheic keratosis: Secondary | ICD-10-CM | POA: Diagnosis not present

## 2023-04-09 DIAGNOSIS — M25562 Pain in left knee: Secondary | ICD-10-CM | POA: Diagnosis not present

## 2023-04-11 DIAGNOSIS — M25562 Pain in left knee: Secondary | ICD-10-CM | POA: Diagnosis not present

## 2023-04-14 DIAGNOSIS — M25562 Pain in left knee: Secondary | ICD-10-CM | POA: Diagnosis not present

## 2023-04-18 DIAGNOSIS — M25562 Pain in left knee: Secondary | ICD-10-CM | POA: Diagnosis not present

## 2023-04-22 DIAGNOSIS — M25562 Pain in left knee: Secondary | ICD-10-CM | POA: Diagnosis not present

## 2023-04-25 DIAGNOSIS — M25562 Pain in left knee: Secondary | ICD-10-CM | POA: Diagnosis not present

## 2023-04-29 DIAGNOSIS — M25562 Pain in left knee: Secondary | ICD-10-CM | POA: Diagnosis not present

## 2023-05-06 DIAGNOSIS — M25562 Pain in left knee: Secondary | ICD-10-CM | POA: Diagnosis not present

## 2023-05-09 DIAGNOSIS — M25562 Pain in left knee: Secondary | ICD-10-CM | POA: Diagnosis not present

## 2023-05-12 ENCOUNTER — Encounter (INDEPENDENT_AMBULATORY_CARE_PROVIDER_SITE_OTHER): Payer: Medicare HMO | Admitting: Ophthalmology

## 2023-05-12 DIAGNOSIS — H353221 Exudative age-related macular degeneration, left eye, with active choroidal neovascularization: Secondary | ICD-10-CM | POA: Diagnosis not present

## 2023-05-12 DIAGNOSIS — H43813 Vitreous degeneration, bilateral: Secondary | ICD-10-CM | POA: Diagnosis not present

## 2023-05-12 DIAGNOSIS — H33302 Unspecified retinal break, left eye: Secondary | ICD-10-CM

## 2023-05-12 DIAGNOSIS — H353112 Nonexudative age-related macular degeneration, right eye, intermediate dry stage: Secondary | ICD-10-CM

## 2023-05-13 DIAGNOSIS — M25562 Pain in left knee: Secondary | ICD-10-CM | POA: Diagnosis not present

## 2023-05-20 DIAGNOSIS — M25562 Pain in left knee: Secondary | ICD-10-CM | POA: Diagnosis not present

## 2023-05-28 DIAGNOSIS — G4733 Obstructive sleep apnea (adult) (pediatric): Secondary | ICD-10-CM | POA: Diagnosis not present

## 2023-05-30 DIAGNOSIS — M25562 Pain in left knee: Secondary | ICD-10-CM | POA: Diagnosis not present

## 2023-06-18 ENCOUNTER — Other Ambulatory Visit: Payer: Self-pay | Admitting: Internal Medicine

## 2023-06-18 DIAGNOSIS — Z1231 Encounter for screening mammogram for malignant neoplasm of breast: Secondary | ICD-10-CM

## 2023-06-19 ENCOUNTER — Encounter (INDEPENDENT_AMBULATORY_CARE_PROVIDER_SITE_OTHER): Payer: Medicare HMO | Admitting: Ophthalmology

## 2023-06-19 DIAGNOSIS — H353221 Exudative age-related macular degeneration, left eye, with active choroidal neovascularization: Secondary | ICD-10-CM | POA: Diagnosis not present

## 2023-06-19 DIAGNOSIS — H43813 Vitreous degeneration, bilateral: Secondary | ICD-10-CM

## 2023-06-19 DIAGNOSIS — H33302 Unspecified retinal break, left eye: Secondary | ICD-10-CM

## 2023-06-19 DIAGNOSIS — H353112 Nonexudative age-related macular degeneration, right eye, intermediate dry stage: Secondary | ICD-10-CM | POA: Diagnosis not present

## 2023-06-25 DIAGNOSIS — M25562 Pain in left knee: Secondary | ICD-10-CM | POA: Diagnosis not present

## 2023-07-18 ENCOUNTER — Ambulatory Visit
Admission: RE | Admit: 2023-07-18 | Discharge: 2023-07-18 | Disposition: A | Discharge status: Home / Self Care | Payer: Medicare HMO | Source: Ambulatory Visit | Attending: Internal Medicine

## 2023-07-18 DIAGNOSIS — Z1231 Encounter for screening mammogram for malignant neoplasm of breast: Secondary | ICD-10-CM | POA: Diagnosis not present

## 2023-07-24 ENCOUNTER — Encounter (INDEPENDENT_AMBULATORY_CARE_PROVIDER_SITE_OTHER): Payer: Medicare HMO | Admitting: Ophthalmology

## 2023-07-24 DIAGNOSIS — H33302 Unspecified retinal break, left eye: Secondary | ICD-10-CM | POA: Diagnosis not present

## 2023-07-24 DIAGNOSIS — H353112 Nonexudative age-related macular degeneration, right eye, intermediate dry stage: Secondary | ICD-10-CM

## 2023-07-24 DIAGNOSIS — H353221 Exudative age-related macular degeneration, left eye, with active choroidal neovascularization: Secondary | ICD-10-CM | POA: Diagnosis not present

## 2023-07-24 DIAGNOSIS — H43813 Vitreous degeneration, bilateral: Secondary | ICD-10-CM | POA: Diagnosis not present

## 2023-08-22 DIAGNOSIS — Z96652 Presence of left artificial knee joint: Secondary | ICD-10-CM | POA: Diagnosis not present

## 2023-08-22 DIAGNOSIS — M1711 Unilateral primary osteoarthritis, right knee: Secondary | ICD-10-CM | POA: Diagnosis not present

## 2023-08-25 ENCOUNTER — Encounter (INDEPENDENT_AMBULATORY_CARE_PROVIDER_SITE_OTHER): Payer: Medicare HMO | Admitting: Ophthalmology

## 2023-08-26 ENCOUNTER — Encounter (INDEPENDENT_AMBULATORY_CARE_PROVIDER_SITE_OTHER): Payer: Medicare HMO | Admitting: Ophthalmology

## 2023-08-26 DIAGNOSIS — H43813 Vitreous degeneration, bilateral: Secondary | ICD-10-CM | POA: Diagnosis not present

## 2023-08-26 DIAGNOSIS — H353221 Exudative age-related macular degeneration, left eye, with active choroidal neovascularization: Secondary | ICD-10-CM

## 2023-08-26 DIAGNOSIS — H33302 Unspecified retinal break, left eye: Secondary | ICD-10-CM

## 2023-08-26 DIAGNOSIS — H353112 Nonexudative age-related macular degeneration, right eye, intermediate dry stage: Secondary | ICD-10-CM

## 2023-10-01 ENCOUNTER — Encounter (INDEPENDENT_AMBULATORY_CARE_PROVIDER_SITE_OTHER): Payer: Medicare HMO | Admitting: Ophthalmology

## 2023-10-01 DIAGNOSIS — H353112 Nonexudative age-related macular degeneration, right eye, intermediate dry stage: Secondary | ICD-10-CM

## 2023-10-01 DIAGNOSIS — H353221 Exudative age-related macular degeneration, left eye, with active choroidal neovascularization: Secondary | ICD-10-CM | POA: Diagnosis not present

## 2023-10-01 DIAGNOSIS — H43813 Vitreous degeneration, bilateral: Secondary | ICD-10-CM | POA: Diagnosis not present

## 2023-10-01 DIAGNOSIS — H33302 Unspecified retinal break, left eye: Secondary | ICD-10-CM | POA: Diagnosis not present

## 2023-10-23 DIAGNOSIS — G4733 Obstructive sleep apnea (adult) (pediatric): Secondary | ICD-10-CM | POA: Diagnosis not present

## 2023-11-04 ENCOUNTER — Encounter (INDEPENDENT_AMBULATORY_CARE_PROVIDER_SITE_OTHER): Payer: Medicare HMO | Admitting: Ophthalmology

## 2023-11-06 ENCOUNTER — Encounter (INDEPENDENT_AMBULATORY_CARE_PROVIDER_SITE_OTHER): Admitting: Ophthalmology

## 2023-11-06 DIAGNOSIS — H43813 Vitreous degeneration, bilateral: Secondary | ICD-10-CM | POA: Diagnosis not present

## 2023-11-06 DIAGNOSIS — H33302 Unspecified retinal break, left eye: Secondary | ICD-10-CM

## 2023-11-06 DIAGNOSIS — H353112 Nonexudative age-related macular degeneration, right eye, intermediate dry stage: Secondary | ICD-10-CM

## 2023-11-06 DIAGNOSIS — H353221 Exudative age-related macular degeneration, left eye, with active choroidal neovascularization: Secondary | ICD-10-CM | POA: Diagnosis not present

## 2023-12-11 ENCOUNTER — Encounter (INDEPENDENT_AMBULATORY_CARE_PROVIDER_SITE_OTHER): Admitting: Ophthalmology

## 2023-12-11 DIAGNOSIS — H353221 Exudative age-related macular degeneration, left eye, with active choroidal neovascularization: Secondary | ICD-10-CM | POA: Diagnosis not present

## 2023-12-11 DIAGNOSIS — H33302 Unspecified retinal break, left eye: Secondary | ICD-10-CM

## 2023-12-11 DIAGNOSIS — H353112 Nonexudative age-related macular degeneration, right eye, intermediate dry stage: Secondary | ICD-10-CM | POA: Diagnosis not present

## 2023-12-11 DIAGNOSIS — H43813 Vitreous degeneration, bilateral: Secondary | ICD-10-CM

## 2024-01-05 DIAGNOSIS — H353132 Nonexudative age-related macular degeneration, bilateral, intermediate dry stage: Secondary | ICD-10-CM | POA: Diagnosis not present

## 2024-01-05 DIAGNOSIS — H524 Presbyopia: Secondary | ICD-10-CM | POA: Diagnosis not present

## 2024-01-05 DIAGNOSIS — Z961 Presence of intraocular lens: Secondary | ICD-10-CM | POA: Diagnosis not present

## 2024-01-15 ENCOUNTER — Encounter (INDEPENDENT_AMBULATORY_CARE_PROVIDER_SITE_OTHER): Admitting: Ophthalmology

## 2024-01-21 DIAGNOSIS — H353111 Nonexudative age-related macular degeneration, right eye, early dry stage: Secondary | ICD-10-CM | POA: Diagnosis not present

## 2024-01-21 DIAGNOSIS — H43813 Vitreous degeneration, bilateral: Secondary | ICD-10-CM | POA: Diagnosis not present

## 2024-01-21 DIAGNOSIS — H353221 Exudative age-related macular degeneration, left eye, with active choroidal neovascularization: Secondary | ICD-10-CM | POA: Diagnosis not present

## 2024-01-21 DIAGNOSIS — H31092 Other chorioretinal scars, left eye: Secondary | ICD-10-CM | POA: Diagnosis not present

## 2024-01-21 DIAGNOSIS — H35373 Puckering of macula, bilateral: Secondary | ICD-10-CM | POA: Diagnosis not present

## 2024-01-22 DIAGNOSIS — G4733 Obstructive sleep apnea (adult) (pediatric): Secondary | ICD-10-CM | POA: Diagnosis not present

## 2024-01-26 DIAGNOSIS — M19041 Primary osteoarthritis, right hand: Secondary | ICD-10-CM | POA: Diagnosis not present

## 2024-01-26 DIAGNOSIS — M7712 Lateral epicondylitis, left elbow: Secondary | ICD-10-CM | POA: Diagnosis not present

## 2024-01-26 DIAGNOSIS — M19042 Primary osteoarthritis, left hand: Secondary | ICD-10-CM | POA: Diagnosis not present

## 2024-01-27 DIAGNOSIS — M1612 Unilateral primary osteoarthritis, left hip: Secondary | ICD-10-CM | POA: Diagnosis not present

## 2024-01-27 DIAGNOSIS — M1711 Unilateral primary osteoarthritis, right knee: Secondary | ICD-10-CM | POA: Diagnosis not present

## 2024-02-04 DIAGNOSIS — M25552 Pain in left hip: Secondary | ICD-10-CM | POA: Diagnosis not present

## 2024-02-11 DIAGNOSIS — H9311 Tinnitus, right ear: Secondary | ICD-10-CM | POA: Diagnosis not present

## 2024-02-11 DIAGNOSIS — H6121 Impacted cerumen, right ear: Secondary | ICD-10-CM | POA: Diagnosis not present

## 2024-02-23 DIAGNOSIS — Z96652 Presence of left artificial knee joint: Secondary | ICD-10-CM | POA: Diagnosis not present

## 2024-02-23 DIAGNOSIS — Z471 Aftercare following joint replacement surgery: Secondary | ICD-10-CM | POA: Diagnosis not present

## 2024-02-23 DIAGNOSIS — S52501A Unspecified fracture of the lower end of right radius, initial encounter for closed fracture: Secondary | ICD-10-CM | POA: Diagnosis not present

## 2024-02-23 DIAGNOSIS — G4733 Obstructive sleep apnea (adult) (pediatric): Secondary | ICD-10-CM | POA: Diagnosis not present

## 2024-02-23 DIAGNOSIS — M25531 Pain in right wrist: Secondary | ICD-10-CM | POA: Diagnosis not present

## 2024-03-12 DIAGNOSIS — S90219A Contusion of unspecified great toe with damage to nail, initial encounter: Secondary | ICD-10-CM | POA: Diagnosis not present

## 2024-04-01 DIAGNOSIS — H353111 Nonexudative age-related macular degeneration, right eye, early dry stage: Secondary | ICD-10-CM | POA: Diagnosis not present

## 2024-04-01 DIAGNOSIS — H353221 Exudative age-related macular degeneration, left eye, with active choroidal neovascularization: Secondary | ICD-10-CM | POA: Diagnosis not present

## 2024-04-01 DIAGNOSIS — H43813 Vitreous degeneration, bilateral: Secondary | ICD-10-CM | POA: Diagnosis not present

## 2024-04-01 DIAGNOSIS — H35373 Puckering of macula, bilateral: Secondary | ICD-10-CM | POA: Diagnosis not present

## 2024-04-01 DIAGNOSIS — S62101D Fracture of unspecified carpal bone, right wrist, subsequent encounter for fracture with routine healing: Secondary | ICD-10-CM | POA: Diagnosis not present

## 2024-04-01 DIAGNOSIS — H31092 Other chorioretinal scars, left eye: Secondary | ICD-10-CM | POA: Diagnosis not present

## 2024-04-12 DIAGNOSIS — E7800 Pure hypercholesterolemia, unspecified: Secondary | ICD-10-CM | POA: Diagnosis not present

## 2024-04-12 DIAGNOSIS — K219 Gastro-esophageal reflux disease without esophagitis: Secondary | ICD-10-CM | POA: Diagnosis not present

## 2024-04-12 DIAGNOSIS — R21 Rash and other nonspecific skin eruption: Secondary | ICD-10-CM | POA: Diagnosis not present

## 2024-04-12 DIAGNOSIS — G43109 Migraine with aura, not intractable, without status migrainosus: Secondary | ICD-10-CM | POA: Diagnosis not present

## 2024-04-12 DIAGNOSIS — R350 Frequency of micturition: Secondary | ICD-10-CM | POA: Diagnosis not present

## 2024-04-12 DIAGNOSIS — F39 Unspecified mood [affective] disorder: Secondary | ICD-10-CM | POA: Diagnosis not present

## 2024-04-21 DIAGNOSIS — L578 Other skin changes due to chronic exposure to nonionizing radiation: Secondary | ICD-10-CM | POA: Diagnosis not present

## 2024-04-21 DIAGNOSIS — L814 Other melanin hyperpigmentation: Secondary | ICD-10-CM | POA: Diagnosis not present

## 2024-04-21 DIAGNOSIS — D2230 Melanocytic nevi of unspecified part of face: Secondary | ICD-10-CM | POA: Diagnosis not present

## 2024-04-21 DIAGNOSIS — D225 Melanocytic nevi of trunk: Secondary | ICD-10-CM | POA: Diagnosis not present

## 2024-04-21 DIAGNOSIS — L309 Dermatitis, unspecified: Secondary | ICD-10-CM | POA: Diagnosis not present

## 2024-04-21 DIAGNOSIS — L821 Other seborrheic keratosis: Secondary | ICD-10-CM | POA: Diagnosis not present

## 2024-04-21 DIAGNOSIS — L570 Actinic keratosis: Secondary | ICD-10-CM | POA: Diagnosis not present

## 2024-04-26 ENCOUNTER — Ambulatory Visit (INDEPENDENT_AMBULATORY_CARE_PROVIDER_SITE_OTHER): Admitting: Otolaryngology

## 2024-04-26 ENCOUNTER — Encounter (INDEPENDENT_AMBULATORY_CARE_PROVIDER_SITE_OTHER): Payer: Self-pay | Admitting: Otolaryngology

## 2024-04-26 VITALS — BP 100/62 | HR 93 | Ht 65.0 in | Wt 190.0 lb

## 2024-04-26 DIAGNOSIS — Z91198 Patient's noncompliance with other medical treatment and regimen for other reason: Secondary | ICD-10-CM

## 2024-04-26 DIAGNOSIS — G4733 Obstructive sleep apnea (adult) (pediatric): Secondary | ICD-10-CM | POA: Diagnosis not present

## 2024-04-26 DIAGNOSIS — Z789 Other specified health status: Secondary | ICD-10-CM

## 2024-04-26 NOTE — Progress Notes (Signed)
 Dear Dr. Arlyss, Here is my assessment for our mutual patient, Cheyenne Miller. Thank you for allowing me the opportunity to care for your patient. Please do not hesitate to contact me should you have any other questions. Sincerely, Dr. Eldora Blanch  Otolaryngology Clinic Note Referring provider: Dr. Arlyss HPI:  Cheyenne Miller is a 68 y.o. female kindly referred by Dr. Arlyss for evaluation of obstructive sleep apnea  Initial visit (04/2024): Was diagnosed with OSA about a year ago, has tried multiple masks (6+, with variety of nasal and FFM styles). Not a good dcandidate for OAT given severity of OSA per sleep. Unable to tolerate it - cannot keep it on and takes it off, some problem with mask fit as well. Using CPAP: not currently Insomnia: no Chest surgery: no Denies dysphonia, dysphagia Workup so far: PSG  ENT Surgery: no Personal or FHx of bleeding dz or anesthesia difficulty: no  GLP-1: no AP/AC: no  Tobacco: no  PMHx: Migraines, MDD/GAD, HLD, OSA  Independent Review of Additional Tests or Records:  Leita Arlyss (02/26/2024) Referral notes reviewed and uploaded or available in chart in media tab - OSA, did not tolerate CPAP; tried variety of nasal masks. Not a good candidate for OAT. Dx: OSA; Rx: ref to [facility] Sleep study independently interpreted (02/23/2024): OSA with AHI 61.2, Central Apnea (5.2); PLM with 27.4; O2 nadir 72% CBC and BMP 02/07/2023: WBC 5.6, Hgb 13.6; BUN/Cr wnl  PMH/Meds/All/SocHx/FamHx/ROS:   Past Medical History:  Diagnosis Date   Anxiety    Arthritis    GERD (gastroesophageal reflux disease)    Headache    migraines   Sleep apnea    wears Cpap     Past Surgical History:  Procedure Laterality Date   cyst removed from wrist Left    ganglion   EYE SURGERY     cataract bil   KNEE ARTHROPLASTY Left 02/20/2023   Procedure: COMPUTER ASSISTED TOTAL KNEE ARTHROPLASTY;  Surgeon: Fidel Rogue, MD;  Location: [facility];  Service:  Orthopedics;  Laterality: Left;  160   SHOULDER ARTHROSCOPY      Family History  Problem Relation Age of Onset   Breast cancer Neg Hx      Social Connections: Not on file      Current Outpatient Medications:    Calcium Carbonate-Vitamin D (CALTRATE 600+D PO), Take 1 tablet by mouth daily., Disp: , Rfl:    Multiple Vitamin (MULTIVITAMIN WITH MINERALS) TABS tablet, Take 1 tablet by mouth daily., Disp: , Rfl:    Multiple Vitamins-Minerals (ICAPS AREDS 2 PO), Take 1 capsule by mouth in the morning and at bedtime., Disp: , Rfl:    ondansetron  (ZOFRAN ) 4 MG tablet, Take 1 tablet (4 mg total) by mouth every 8 (eight) hours as needed for nausea or vomiting., Disp: 20 tablet, Rfl: 0   Peppermint Oil (IBGARD PO), Take 1 tablet by mouth daily as needed (GI Discomfort)., Disp: , Rfl:    polyethylene glycol powder (MIRALAX ) 17 GM/SCOOP powder, Mix 17 g by in 4 oz of water  or juice & take by mouth daily as needed for moderate constipation or severe constipation., Disp: 238 g, Rfl: 0   rosuvastatin (CRESTOR) 10 MG tablet, Take 5 mg by mouth daily., Disp: , Rfl:    sertraline (ZOLOFT) 100 MG tablet, Take 150 mg by mouth daily., Disp: , Rfl:    SUMAtriptan (IMITREX) 100 MG tablet, Take 100 mg by mouth every 2 (two) hours as needed for migraine. May repeat in 2 hours  if headache persists or recurs., Disp: , Rfl:    benzonatate  (TESSALON ) 200 MG capsule, Take 1 capsule (200 mg total) by mouth 3 (three) times daily as needed for cough. (Patient not taking: Reported on 04/26/2024), Disp: 30 capsule, Rfl: 0   Physical Exam:   BP 100/62 (BP Location: Left Arm, Patient Position: Sitting, Cuff Size: Large)   Pulse 93   Ht 5' 5 (1.651 m)   Wt 190 lb (86.2 kg)   SpO2 92%   BMI 31.62 kg/m   Salient findings:  CN II-XII intact Bilateral EAC clear and TM intact with well pneumatized middle ear spaces Anterior rhinoscopy: Septum intact; bilateral inferior turbinates without significant hypertrophy No lesions  of oral cavity/oropharynx; tonsils 1/1, Tongue Friedman 3 No obviously palpable neck masses/lymphadenopathy/thyromegaly No respiratory distress or stridor BMI 31.62  Seprately Identifiable Procedures:  Prior to initiating any procedures, risks/benefits/alternatives were explained to the patient and verbal consent obtained. Procedure Note Pre-procedure diagnosis:  Obstructive sleep apnea, rule out structural cause Post-procedure diagnosis: Same Procedure: Transnasal Fiberoptic Laryngoscopy, CPT 31575 - Mod 25 Indication: see above Complications: None apparent EBL: 0 mL  The procedure was undertaken to further evaluate the patient's complaint above, with mirror exam inadequate for appropriate examination due to gag reflex and poor patient tolerance  Procedure:  Patient was identified as correct patient. Verbal consent was obtained. The nose was sprayed with oxymetazoline and 4% lidocaine. The The flexible laryngoscope was passed through the nose to view the nasal cavity, pharynx (oropharynx, hypopharynx) and larynx.  The larynx was examined at rest and during multiple phonatory tasks. Documentation was obtained and reviewed with patient. The scope was removed. The patient tolerated the procedure well.  Findings: The nasal cavity and nasopharynx did not reveal any masses or lesions, mucosa appeared to be without obvious lesions. The tongue base, pharyngeal walls, piriform sinuses, vallecula, epiglottis and postcricoid region are normal in appearance. Muller maneuver negative; The visualized portion of the subglottis and proximal trachea is widely patent. The vocal folds are mobile bilaterally. There are no lesions on the free edge of the vocal folds nor elsewhere in the larynx worrisome for malignancy.    Electronically signed by: Eldora KATHEE Blanch, MD 04/26/2024 7:14 PM   Impression & Plans:  Cheyenne Miller is a 68 y.o. female with:  1. OSA (obstructive sleep apnea)   2. Intolerance of  continuous positive airway pressure (CPAP) ventilation    Severe OSA, has tried multiple masks but cannot tolerate. AHI 61.  We discussed options including continued CPAP v/s Inspire v/s other therapies such as multilevel airway surgery. Some PLMs but no insomnia, and she does not have issues with RLS.  We had a discussion regarding risks of Inspire including lack of benefit, persistent symptoms, pneumothorax, tongue soreness or weakness, Floor of mouth numbness, injury to major vessels, hematoma, implant infection, bleeding, scarring, tethering of neck, persistent symptoms, need for further procedures, and risk of anesthesia among others.   She understands this and would like to proceed with DISE and, if approved, then proceed with CN 12 implant.  See below regarding exact medications prescribed this encounter including dosages and route: No orders of the defined types were placed in this encounter.     Thank you for allowing me the opportunity to care for your patient. Please do not hesitate to contact me should you have any other questions.  Sincerely, Eldora Blanch, MD Otolaryngologist (ENT), [facility] Phone: [phone] Fax: [phone]  04/26/2024, 7:14 PM  MDM:  Level 4 - [id number] Complexity/Problems addressed: mod Data complexity: mod - independent review of notes, labs, review test - Morbidity: mod - decision for surgery - Prescription Drug prescribed or managed: no

## 2024-04-29 DIAGNOSIS — H353111 Nonexudative age-related macular degeneration, right eye, early dry stage: Secondary | ICD-10-CM | POA: Diagnosis not present

## 2024-04-29 DIAGNOSIS — H353221 Exudative age-related macular degeneration, left eye, with active choroidal neovascularization: Secondary | ICD-10-CM | POA: Diagnosis not present

## 2024-04-29 DIAGNOSIS — H43813 Vitreous degeneration, bilateral: Secondary | ICD-10-CM | POA: Diagnosis not present

## 2024-04-29 DIAGNOSIS — H31092 Other chorioretinal scars, left eye: Secondary | ICD-10-CM | POA: Diagnosis not present

## 2024-04-29 DIAGNOSIS — H35373 Puckering of macula, bilateral: Secondary | ICD-10-CM | POA: Diagnosis not present

## 2024-05-07 DIAGNOSIS — M85851 Other specified disorders of bone density and structure, right thigh: Secondary | ICD-10-CM | POA: Diagnosis not present

## 2024-05-07 DIAGNOSIS — M85852 Other specified disorders of bone density and structure, left thigh: Secondary | ICD-10-CM | POA: Diagnosis not present

## 2024-05-17 DIAGNOSIS — Z77090 Contact with and (suspected) exposure to asbestos: Secondary | ICD-10-CM | POA: Diagnosis not present

## 2024-05-17 DIAGNOSIS — E7800 Pure hypercholesterolemia, unspecified: Secondary | ICD-10-CM | POA: Diagnosis not present

## 2024-05-17 DIAGNOSIS — I700 Atherosclerosis of aorta: Secondary | ICD-10-CM | POA: Diagnosis not present

## 2024-06-02 DIAGNOSIS — H353111 Nonexudative age-related macular degeneration, right eye, early dry stage: Secondary | ICD-10-CM | POA: Diagnosis not present

## 2024-06-02 DIAGNOSIS — H43813 Vitreous degeneration, bilateral: Secondary | ICD-10-CM | POA: Diagnosis not present

## 2024-06-02 DIAGNOSIS — H35373 Puckering of macula, bilateral: Secondary | ICD-10-CM | POA: Diagnosis not present

## 2024-06-02 DIAGNOSIS — H31092 Other chorioretinal scars, left eye: Secondary | ICD-10-CM | POA: Diagnosis not present

## 2024-06-02 DIAGNOSIS — H353221 Exudative age-related macular degeneration, left eye, with active choroidal neovascularization: Secondary | ICD-10-CM | POA: Diagnosis not present

## 2024-06-10 DIAGNOSIS — E669 Obesity, unspecified: Secondary | ICD-10-CM | POA: Diagnosis not present

## 2024-06-10 DIAGNOSIS — R300 Dysuria: Secondary | ICD-10-CM | POA: Diagnosis not present

## 2024-06-14 ENCOUNTER — Encounter (HOSPITAL_BASED_OUTPATIENT_CLINIC_OR_DEPARTMENT_OTHER): Payer: Self-pay

## 2024-06-14 ENCOUNTER — Ambulatory Visit (HOSPITAL_BASED_OUTPATIENT_CLINIC_OR_DEPARTMENT_OTHER): Admit: 2024-06-14

## 2024-06-14 SURGERY — DRUG INDUCED SLEEP ENDOSCOPY
Anesthesia: General

## 2024-07-06 ENCOUNTER — Other Ambulatory Visit: Payer: Self-pay | Admitting: Internal Medicine

## 2024-07-06 DIAGNOSIS — Z1231 Encounter for screening mammogram for malignant neoplasm of breast: Secondary | ICD-10-CM

## 2024-07-19 DIAGNOSIS — M5136 Other intervertebral disc degeneration, lumbar region with discogenic back pain only: Secondary | ICD-10-CM | POA: Diagnosis not present

## 2024-07-19 DIAGNOSIS — M5459 Other low back pain: Secondary | ICD-10-CM | POA: Diagnosis not present

## 2024-07-20 DIAGNOSIS — H353221 Exudative age-related macular degeneration, left eye, with active choroidal neovascularization: Secondary | ICD-10-CM | POA: Diagnosis not present

## 2024-08-03 ENCOUNTER — Ambulatory Visit
Admission: RE | Admit: 2024-08-03 | Discharge: 2024-08-03 | Disposition: A | Discharge status: Home / Self Care | Source: Ambulatory Visit | Attending: Internal Medicine | Admitting: Internal Medicine

## 2024-08-03 DIAGNOSIS — Z1231 Encounter for screening mammogram for malignant neoplasm of breast: Secondary | ICD-10-CM
